# Patient Record
Sex: Male | Born: 1935
Health system: Southern US, Community
[De-identification: ages and names within clinical notes are randomized; demographics above are authoritative.]

## PROBLEM LIST (undated history)

## (undated) DIAGNOSIS — L0390 Cellulitis, unspecified: Secondary | ICD-10-CM

## (undated) DIAGNOSIS — M1990 Unspecified osteoarthritis, unspecified site: Secondary | ICD-10-CM

## (undated) DIAGNOSIS — C801 Malignant (primary) neoplasm, unspecified: Secondary | ICD-10-CM

## (undated) DIAGNOSIS — R42 Dizziness and giddiness: Secondary | ICD-10-CM

## (undated) DIAGNOSIS — J189 Pneumonia, unspecified organism: Secondary | ICD-10-CM

## (undated) DIAGNOSIS — R0600 Dyspnea, unspecified: Secondary | ICD-10-CM

## (undated) HISTORY — PX: COLONOSCOPY W/ POLYPECTOMY: SHX1380

## (undated) HISTORY — PX: NASAL HEMORRHAGE CONTROL: SHX287

## (undated) HISTORY — PX: TONSILLECTOMY: SUR1361

---

## 2011-05-12 HISTORY — PX: PROSTATECTOMY: SHX69

## 2011-12-28 DIAGNOSIS — R972 Elevated prostate specific antigen [PSA]: Secondary | ICD-10-CM | POA: Diagnosis not present

## 2011-12-28 DIAGNOSIS — C61 Malignant neoplasm of prostate: Secondary | ICD-10-CM | POA: Diagnosis not present

## 2011-12-28 DIAGNOSIS — E669 Obesity, unspecified: Secondary | ICD-10-CM | POA: Diagnosis not present

## 2012-01-13 DIAGNOSIS — R209 Unspecified disturbances of skin sensation: Secondary | ICD-10-CM | POA: Diagnosis not present

## 2012-01-13 DIAGNOSIS — J45909 Unspecified asthma, uncomplicated: Secondary | ICD-10-CM | POA: Diagnosis not present

## 2012-01-13 DIAGNOSIS — R609 Edema, unspecified: Secondary | ICD-10-CM | POA: Diagnosis not present

## 2012-01-13 DIAGNOSIS — R5381 Other malaise: Secondary | ICD-10-CM | POA: Diagnosis not present

## 2012-01-13 DIAGNOSIS — L299 Pruritus, unspecified: Secondary | ICD-10-CM | POA: Diagnosis not present

## 2012-01-13 DIAGNOSIS — R5383 Other fatigue: Secondary | ICD-10-CM | POA: Diagnosis not present

## 2012-01-22 DIAGNOSIS — L219 Seborrheic dermatitis, unspecified: Secondary | ICD-10-CM | POA: Diagnosis not present

## 2012-01-22 DIAGNOSIS — L981 Factitial dermatitis: Secondary | ICD-10-CM | POA: Diagnosis not present

## 2012-02-22 DIAGNOSIS — L219 Seborrheic dermatitis, unspecified: Secondary | ICD-10-CM | POA: Diagnosis not present

## 2012-03-24 DIAGNOSIS — E039 Hypothyroidism, unspecified: Secondary | ICD-10-CM | POA: Diagnosis not present

## 2012-03-24 DIAGNOSIS — I10 Essential (primary) hypertension: Secondary | ICD-10-CM | POA: Diagnosis not present

## 2012-03-24 DIAGNOSIS — Z8546 Personal history of malignant neoplasm of prostate: Secondary | ICD-10-CM | POA: Diagnosis not present

## 2012-03-24 DIAGNOSIS — E538 Deficiency of other specified B group vitamins: Secondary | ICD-10-CM | POA: Diagnosis not present

## 2012-04-28 DIAGNOSIS — E538 Deficiency of other specified B group vitamins: Secondary | ICD-10-CM | POA: Diagnosis not present

## 2012-04-28 DIAGNOSIS — R7989 Other specified abnormal findings of blood chemistry: Secondary | ICD-10-CM | POA: Diagnosis not present

## 2012-09-28 DIAGNOSIS — H2510 Age-related nuclear cataract, unspecified eye: Secondary | ICD-10-CM | POA: Diagnosis not present

## 2012-09-28 DIAGNOSIS — H5210 Myopia, unspecified eye: Secondary | ICD-10-CM | POA: Diagnosis not present

## 2013-02-08 DIAGNOSIS — R972 Elevated prostate specific antigen [PSA]: Secondary | ICD-10-CM | POA: Diagnosis not present

## 2013-02-08 DIAGNOSIS — E669 Obesity, unspecified: Secondary | ICD-10-CM | POA: Diagnosis not present

## 2014-01-17 DIAGNOSIS — R7301 Impaired fasting glucose: Secondary | ICD-10-CM | POA: Diagnosis not present

## 2014-01-17 DIAGNOSIS — M25559 Pain in unspecified hip: Secondary | ICD-10-CM | POA: Diagnosis not present

## 2014-01-17 DIAGNOSIS — M549 Dorsalgia, unspecified: Secondary | ICD-10-CM | POA: Diagnosis not present

## 2014-01-17 DIAGNOSIS — M542 Cervicalgia: Secondary | ICD-10-CM | POA: Diagnosis not present

## 2014-01-17 DIAGNOSIS — M2550 Pain in unspecified joint: Secondary | ICD-10-CM | POA: Diagnosis not present

## 2014-01-17 DIAGNOSIS — M25549 Pain in joints of unspecified hand: Secondary | ICD-10-CM | POA: Diagnosis not present

## 2014-01-17 DIAGNOSIS — Z8546 Personal history of malignant neoplasm of prostate: Secondary | ICD-10-CM | POA: Diagnosis not present

## 2014-01-17 DIAGNOSIS — R946 Abnormal results of thyroid function studies: Secondary | ICD-10-CM | POA: Diagnosis not present

## 2014-01-19 DIAGNOSIS — Z8546 Personal history of malignant neoplasm of prostate: Secondary | ICD-10-CM | POA: Diagnosis not present

## 2014-01-19 DIAGNOSIS — I998 Other disorder of circulatory system: Secondary | ICD-10-CM | POA: Diagnosis not present

## 2014-01-19 DIAGNOSIS — M549 Dorsalgia, unspecified: Secondary | ICD-10-CM | POA: Diagnosis not present

## 2014-01-19 DIAGNOSIS — M5380 Other specified dorsopathies, site unspecified: Secondary | ICD-10-CM | POA: Diagnosis not present

## 2014-01-19 DIAGNOSIS — M545 Low back pain, unspecified: Secondary | ICD-10-CM | POA: Diagnosis not present

## 2014-01-19 DIAGNOSIS — M47812 Spondylosis without myelopathy or radiculopathy, cervical region: Secondary | ICD-10-CM | POA: Diagnosis not present

## 2014-01-19 DIAGNOSIS — M19049 Primary osteoarthritis, unspecified hand: Secondary | ICD-10-CM | POA: Diagnosis not present

## 2014-01-19 DIAGNOSIS — M25549 Pain in joints of unspecified hand: Secondary | ICD-10-CM | POA: Diagnosis not present

## 2014-01-19 DIAGNOSIS — C61 Malignant neoplasm of prostate: Secondary | ICD-10-CM | POA: Diagnosis not present

## 2014-01-19 DIAGNOSIS — M25539 Pain in unspecified wrist: Secondary | ICD-10-CM | POA: Diagnosis not present

## 2014-01-19 DIAGNOSIS — M25559 Pain in unspecified hip: Secondary | ICD-10-CM | POA: Diagnosis not present

## 2014-01-30 DIAGNOSIS — M159 Polyosteoarthritis, unspecified: Secondary | ICD-10-CM | POA: Diagnosis not present

## 2014-01-30 DIAGNOSIS — M5030 Other cervical disc degeneration, unspecified cervical region: Secondary | ICD-10-CM | POA: Diagnosis not present

## 2014-01-30 DIAGNOSIS — E039 Hypothyroidism, unspecified: Secondary | ICD-10-CM | POA: Diagnosis not present

## 2014-02-08 DIAGNOSIS — M4802 Spinal stenosis, cervical region: Secondary | ICD-10-CM | POA: Diagnosis not present

## 2014-02-08 DIAGNOSIS — M542 Cervicalgia: Secondary | ICD-10-CM | POA: Diagnosis not present

## 2014-02-26 DIAGNOSIS — M542 Cervicalgia: Secondary | ICD-10-CM | POA: Insufficient documentation

## 2014-02-26 DIAGNOSIS — M5136 Other intervertebral disc degeneration, lumbar region: Secondary | ICD-10-CM | POA: Diagnosis not present

## 2014-02-26 DIAGNOSIS — M47816 Spondylosis without myelopathy or radiculopathy, lumbar region: Secondary | ICD-10-CM | POA: Diagnosis not present

## 2014-02-26 DIAGNOSIS — R2681 Unsteadiness on feet: Secondary | ICD-10-CM | POA: Diagnosis not present

## 2014-02-26 DIAGNOSIS — M545 Low back pain, unspecified: Secondary | ICD-10-CM | POA: Insufficient documentation

## 2014-02-26 DIAGNOSIS — G8929 Other chronic pain: Secondary | ICD-10-CM | POA: Insufficient documentation

## 2014-03-05 DIAGNOSIS — S335XXD Sprain of ligaments of lumbar spine, subsequent encounter: Secondary | ICD-10-CM | POA: Diagnosis not present

## 2014-03-07 DIAGNOSIS — S335XXD Sprain of ligaments of lumbar spine, subsequent encounter: Secondary | ICD-10-CM | POA: Diagnosis not present

## 2014-03-09 DIAGNOSIS — S335XXD Sprain of ligaments of lumbar spine, subsequent encounter: Secondary | ICD-10-CM | POA: Diagnosis not present

## 2014-03-13 DIAGNOSIS — S335XXD Sprain of ligaments of lumbar spine, subsequent encounter: Secondary | ICD-10-CM | POA: Diagnosis not present

## 2014-03-15 DIAGNOSIS — S335XXD Sprain of ligaments of lumbar spine, subsequent encounter: Secondary | ICD-10-CM | POA: Diagnosis not present

## 2014-03-21 DIAGNOSIS — S335XXD Sprain of ligaments of lumbar spine, subsequent encounter: Secondary | ICD-10-CM | POA: Diagnosis not present

## 2014-03-26 DIAGNOSIS — S335XXD Sprain of ligaments of lumbar spine, subsequent encounter: Secondary | ICD-10-CM | POA: Diagnosis not present

## 2014-03-28 DIAGNOSIS — S335XXD Sprain of ligaments of lumbar spine, subsequent encounter: Secondary | ICD-10-CM | POA: Diagnosis not present

## 2014-04-02 DIAGNOSIS — S335XXD Sprain of ligaments of lumbar spine, subsequent encounter: Secondary | ICD-10-CM | POA: Diagnosis not present

## 2014-04-04 DIAGNOSIS — S335XXD Sprain of ligaments of lumbar spine, subsequent encounter: Secondary | ICD-10-CM | POA: Diagnosis not present

## 2014-04-11 DIAGNOSIS — S335XXD Sprain of ligaments of lumbar spine, subsequent encounter: Secondary | ICD-10-CM | POA: Diagnosis not present

## 2014-04-17 DIAGNOSIS — S335XXD Sprain of ligaments of lumbar spine, subsequent encounter: Secondary | ICD-10-CM | POA: Diagnosis not present

## 2014-04-26 DIAGNOSIS — S335XXD Sprain of ligaments of lumbar spine, subsequent encounter: Secondary | ICD-10-CM | POA: Diagnosis not present

## 2014-10-02 DIAGNOSIS — M2550 Pain in unspecified joint: Secondary | ICD-10-CM | POA: Diagnosis not present

## 2014-10-09 DIAGNOSIS — E039 Hypothyroidism, unspecified: Secondary | ICD-10-CM | POA: Diagnosis not present

## 2014-10-09 DIAGNOSIS — Z8546 Personal history of malignant neoplasm of prostate: Secondary | ICD-10-CM | POA: Diagnosis not present

## 2014-10-09 DIAGNOSIS — R7301 Impaired fasting glucose: Secondary | ICD-10-CM | POA: Diagnosis not present

## 2015-01-17 DIAGNOSIS — B359 Dermatophytosis, unspecified: Secondary | ICD-10-CM | POA: Diagnosis not present

## 2015-01-17 DIAGNOSIS — M7989 Other specified soft tissue disorders: Secondary | ICD-10-CM | POA: Diagnosis not present

## 2015-01-17 DIAGNOSIS — M2550 Pain in unspecified joint: Secondary | ICD-10-CM | POA: Diagnosis not present

## 2015-01-17 DIAGNOSIS — I872 Venous insufficiency (chronic) (peripheral): Secondary | ICD-10-CM | POA: Diagnosis not present

## 2015-05-10 DIAGNOSIS — R739 Hyperglycemia, unspecified: Secondary | ICD-10-CM | POA: Diagnosis not present

## 2015-05-10 DIAGNOSIS — R609 Edema, unspecified: Secondary | ICD-10-CM | POA: Diagnosis not present

## 2015-05-10 DIAGNOSIS — L03115 Cellulitis of right lower limb: Secondary | ICD-10-CM | POA: Diagnosis not present

## 2015-05-10 DIAGNOSIS — R0602 Shortness of breath: Secondary | ICD-10-CM | POA: Diagnosis not present

## 2015-05-10 DIAGNOSIS — M7989 Other specified soft tissue disorders: Secondary | ICD-10-CM | POA: Diagnosis not present

## 2015-05-10 DIAGNOSIS — M79606 Pain in leg, unspecified: Secondary | ICD-10-CM | POA: Diagnosis not present

## 2015-05-10 DIAGNOSIS — R52 Pain, unspecified: Secondary | ICD-10-CM | POA: Diagnosis not present

## 2015-05-10 DIAGNOSIS — I160 Hypertensive urgency: Secondary | ICD-10-CM | POA: Diagnosis not present

## 2015-05-10 DIAGNOSIS — Z79899 Other long term (current) drug therapy: Secondary | ICD-10-CM | POA: Diagnosis not present

## 2015-05-10 DIAGNOSIS — Z7982 Long term (current) use of aspirin: Secondary | ICD-10-CM | POA: Diagnosis not present

## 2015-05-21 DIAGNOSIS — M7989 Other specified soft tissue disorders: Secondary | ICD-10-CM | POA: Diagnosis not present

## 2015-05-21 DIAGNOSIS — L03115 Cellulitis of right lower limb: Secondary | ICD-10-CM | POA: Diagnosis not present

## 2015-05-21 DIAGNOSIS — I872 Venous insufficiency (chronic) (peripheral): Secondary | ICD-10-CM | POA: Diagnosis not present

## 2015-05-30 DIAGNOSIS — L03119 Cellulitis of unspecified part of limb: Secondary | ICD-10-CM | POA: Diagnosis not present

## 2015-05-30 DIAGNOSIS — Z79899 Other long term (current) drug therapy: Secondary | ICD-10-CM | POA: Diagnosis not present

## 2015-06-21 DIAGNOSIS — L299 Pruritus, unspecified: Secondary | ICD-10-CM | POA: Diagnosis not present

## 2015-06-21 DIAGNOSIS — I8310 Varicose veins of unspecified lower extremity with inflammation: Secondary | ICD-10-CM | POA: Diagnosis not present

## 2015-06-21 DIAGNOSIS — L0100 Impetigo, unspecified: Secondary | ICD-10-CM | POA: Diagnosis not present

## 2015-06-21 DIAGNOSIS — L853 Xerosis cutis: Secondary | ICD-10-CM | POA: Diagnosis not present

## 2015-06-25 DIAGNOSIS — E039 Hypothyroidism, unspecified: Secondary | ICD-10-CM | POA: Diagnosis not present

## 2015-06-25 DIAGNOSIS — I872 Venous insufficiency (chronic) (peripheral): Secondary | ICD-10-CM | POA: Diagnosis not present

## 2015-06-25 DIAGNOSIS — M2550 Pain in unspecified joint: Secondary | ICD-10-CM | POA: Diagnosis not present

## 2015-06-25 DIAGNOSIS — J019 Acute sinusitis, unspecified: Secondary | ICD-10-CM | POA: Diagnosis not present

## 2015-06-25 DIAGNOSIS — L03119 Cellulitis of unspecified part of limb: Secondary | ICD-10-CM | POA: Diagnosis not present

## 2015-06-25 DIAGNOSIS — Z79899 Other long term (current) drug therapy: Secondary | ICD-10-CM | POA: Diagnosis not present

## 2015-07-03 DIAGNOSIS — R600 Localized edema: Secondary | ICD-10-CM | POA: Diagnosis not present

## 2015-07-03 DIAGNOSIS — E039 Hypothyroidism, unspecified: Secondary | ICD-10-CM | POA: Diagnosis not present

## 2015-07-03 DIAGNOSIS — M2550 Pain in unspecified joint: Secondary | ICD-10-CM | POA: Diagnosis not present

## 2015-08-06 DIAGNOSIS — M5030 Other cervical disc degeneration, unspecified cervical region: Secondary | ICD-10-CM | POA: Diagnosis not present

## 2015-08-06 DIAGNOSIS — M6281 Muscle weakness (generalized): Secondary | ICD-10-CM | POA: Diagnosis not present

## 2015-08-06 DIAGNOSIS — R2689 Other abnormalities of gait and mobility: Secondary | ICD-10-CM | POA: Diagnosis not present

## 2015-08-12 DIAGNOSIS — R2689 Other abnormalities of gait and mobility: Secondary | ICD-10-CM | POA: Diagnosis not present

## 2015-08-12 DIAGNOSIS — M6281 Muscle weakness (generalized): Secondary | ICD-10-CM | POA: Diagnosis not present

## 2015-08-12 DIAGNOSIS — M5030 Other cervical disc degeneration, unspecified cervical region: Secondary | ICD-10-CM | POA: Diagnosis not present

## 2015-08-14 DIAGNOSIS — R2689 Other abnormalities of gait and mobility: Secondary | ICD-10-CM | POA: Diagnosis not present

## 2015-08-14 DIAGNOSIS — M5030 Other cervical disc degeneration, unspecified cervical region: Secondary | ICD-10-CM | POA: Diagnosis not present

## 2015-08-14 DIAGNOSIS — M6281 Muscle weakness (generalized): Secondary | ICD-10-CM | POA: Diagnosis not present

## 2015-08-19 DIAGNOSIS — M5030 Other cervical disc degeneration, unspecified cervical region: Secondary | ICD-10-CM | POA: Diagnosis not present

## 2015-08-19 DIAGNOSIS — R2689 Other abnormalities of gait and mobility: Secondary | ICD-10-CM | POA: Diagnosis not present

## 2015-08-19 DIAGNOSIS — M6281 Muscle weakness (generalized): Secondary | ICD-10-CM | POA: Diagnosis not present

## 2015-08-21 DIAGNOSIS — Z79899 Other long term (current) drug therapy: Secondary | ICD-10-CM | POA: Diagnosis not present

## 2015-08-21 DIAGNOSIS — M2550 Pain in unspecified joint: Secondary | ICD-10-CM | POA: Diagnosis not present

## 2015-08-21 DIAGNOSIS — R7301 Impaired fasting glucose: Secondary | ICD-10-CM | POA: Diagnosis not present

## 2015-08-21 DIAGNOSIS — G4700 Insomnia, unspecified: Secondary | ICD-10-CM | POA: Diagnosis not present

## 2015-08-21 DIAGNOSIS — E538 Deficiency of other specified B group vitamins: Secondary | ICD-10-CM | POA: Diagnosis not present

## 2015-08-21 DIAGNOSIS — R5383 Other fatigue: Secondary | ICD-10-CM | POA: Diagnosis not present

## 2015-08-21 DIAGNOSIS — I872 Venous insufficiency (chronic) (peripheral): Secondary | ICD-10-CM | POA: Diagnosis not present

## 2015-08-21 DIAGNOSIS — Z8546 Personal history of malignant neoplasm of prostate: Secondary | ICD-10-CM | POA: Diagnosis not present

## 2015-08-21 DIAGNOSIS — E039 Hypothyroidism, unspecified: Secondary | ICD-10-CM | POA: Diagnosis not present

## 2015-08-26 DIAGNOSIS — M5030 Other cervical disc degeneration, unspecified cervical region: Secondary | ICD-10-CM | POA: Diagnosis not present

## 2015-08-26 DIAGNOSIS — M6281 Muscle weakness (generalized): Secondary | ICD-10-CM | POA: Diagnosis not present

## 2015-08-26 DIAGNOSIS — R2689 Other abnormalities of gait and mobility: Secondary | ICD-10-CM | POA: Diagnosis not present

## 2015-08-28 DIAGNOSIS — M5030 Other cervical disc degeneration, unspecified cervical region: Secondary | ICD-10-CM | POA: Diagnosis not present

## 2015-08-28 DIAGNOSIS — M6281 Muscle weakness (generalized): Secondary | ICD-10-CM | POA: Diagnosis not present

## 2015-08-28 DIAGNOSIS — R2689 Other abnormalities of gait and mobility: Secondary | ICD-10-CM | POA: Diagnosis not present

## 2015-09-02 DIAGNOSIS — M5030 Other cervical disc degeneration, unspecified cervical region: Secondary | ICD-10-CM | POA: Diagnosis not present

## 2015-09-02 DIAGNOSIS — M6281 Muscle weakness (generalized): Secondary | ICD-10-CM | POA: Diagnosis not present

## 2015-09-02 DIAGNOSIS — R2689 Other abnormalities of gait and mobility: Secondary | ICD-10-CM | POA: Diagnosis not present

## 2015-09-02 DIAGNOSIS — M62838 Other muscle spasm: Secondary | ICD-10-CM | POA: Diagnosis not present

## 2015-09-04 DIAGNOSIS — M5030 Other cervical disc degeneration, unspecified cervical region: Secondary | ICD-10-CM | POA: Diagnosis not present

## 2015-09-04 DIAGNOSIS — M6281 Muscle weakness (generalized): Secondary | ICD-10-CM | POA: Diagnosis not present

## 2015-09-04 DIAGNOSIS — R2689 Other abnormalities of gait and mobility: Secondary | ICD-10-CM | POA: Diagnosis not present

## 2015-09-09 DIAGNOSIS — M5030 Other cervical disc degeneration, unspecified cervical region: Secondary | ICD-10-CM | POA: Diagnosis not present

## 2015-09-09 DIAGNOSIS — R2689 Other abnormalities of gait and mobility: Secondary | ICD-10-CM | POA: Diagnosis not present

## 2015-09-09 DIAGNOSIS — M6281 Muscle weakness (generalized): Secondary | ICD-10-CM | POA: Diagnosis not present

## 2015-09-11 DIAGNOSIS — M5030 Other cervical disc degeneration, unspecified cervical region: Secondary | ICD-10-CM | POA: Diagnosis not present

## 2015-09-11 DIAGNOSIS — M6281 Muscle weakness (generalized): Secondary | ICD-10-CM | POA: Diagnosis not present

## 2015-09-11 DIAGNOSIS — R2689 Other abnormalities of gait and mobility: Secondary | ICD-10-CM | POA: Diagnosis not present

## 2015-09-25 DIAGNOSIS — M6281 Muscle weakness (generalized): Secondary | ICD-10-CM | POA: Diagnosis not present

## 2015-09-25 DIAGNOSIS — R2689 Other abnormalities of gait and mobility: Secondary | ICD-10-CM | POA: Diagnosis not present

## 2015-09-25 DIAGNOSIS — M5030 Other cervical disc degeneration, unspecified cervical region: Secondary | ICD-10-CM | POA: Diagnosis not present

## 2015-09-26 DIAGNOSIS — Z6841 Body Mass Index (BMI) 40.0 and over, adult: Secondary | ICD-10-CM | POA: Diagnosis not present

## 2015-09-26 DIAGNOSIS — C61 Malignant neoplasm of prostate: Secondary | ICD-10-CM | POA: Diagnosis not present

## 2015-10-02 DIAGNOSIS — M5030 Other cervical disc degeneration, unspecified cervical region: Secondary | ICD-10-CM | POA: Diagnosis not present

## 2015-10-02 DIAGNOSIS — M6281 Muscle weakness (generalized): Secondary | ICD-10-CM | POA: Diagnosis not present

## 2015-10-02 DIAGNOSIS — R2689 Other abnormalities of gait and mobility: Secondary | ICD-10-CM | POA: Diagnosis not present

## 2015-10-09 DIAGNOSIS — M5030 Other cervical disc degeneration, unspecified cervical region: Secondary | ICD-10-CM | POA: Diagnosis not present

## 2015-10-09 DIAGNOSIS — R2689 Other abnormalities of gait and mobility: Secondary | ICD-10-CM | POA: Diagnosis not present

## 2015-10-09 DIAGNOSIS — M6281 Muscle weakness (generalized): Secondary | ICD-10-CM | POA: Diagnosis not present

## 2015-10-29 DIAGNOSIS — Z8546 Personal history of malignant neoplasm of prostate: Secondary | ICD-10-CM | POA: Diagnosis not present

## 2015-10-29 DIAGNOSIS — H6982 Other specified disorders of Eustachian tube, left ear: Secondary | ICD-10-CM | POA: Diagnosis not present

## 2015-10-29 DIAGNOSIS — H60542 Acute eczematoid otitis externa, left ear: Secondary | ICD-10-CM | POA: Diagnosis not present

## 2015-11-06 DIAGNOSIS — C61 Malignant neoplasm of prostate: Secondary | ICD-10-CM | POA: Diagnosis not present

## 2015-11-06 DIAGNOSIS — Z6841 Body Mass Index (BMI) 40.0 and over, adult: Secondary | ICD-10-CM | POA: Diagnosis not present

## 2016-01-07 ENCOUNTER — Other Ambulatory Visit: Payer: Self-pay

## 2016-03-24 DIAGNOSIS — H524 Presbyopia: Secondary | ICD-10-CM | POA: Diagnosis not present

## 2016-03-24 DIAGNOSIS — H2513 Age-related nuclear cataract, bilateral: Secondary | ICD-10-CM | POA: Diagnosis not present

## 2016-03-24 DIAGNOSIS — H40003 Preglaucoma, unspecified, bilateral: Secondary | ICD-10-CM | POA: Diagnosis not present

## 2016-04-09 DIAGNOSIS — J019 Acute sinusitis, unspecified: Secondary | ICD-10-CM | POA: Diagnosis not present

## 2016-04-09 DIAGNOSIS — R42 Dizziness and giddiness: Secondary | ICD-10-CM | POA: Diagnosis not present

## 2016-04-14 DIAGNOSIS — H401131 Primary open-angle glaucoma, bilateral, mild stage: Secondary | ICD-10-CM | POA: Diagnosis not present

## 2016-05-25 DIAGNOSIS — E039 Hypothyroidism, unspecified: Secondary | ICD-10-CM | POA: Diagnosis not present

## 2016-05-25 DIAGNOSIS — R7301 Impaired fasting glucose: Secondary | ICD-10-CM | POA: Diagnosis not present

## 2016-05-25 DIAGNOSIS — E538 Deficiency of other specified B group vitamins: Secondary | ICD-10-CM | POA: Diagnosis not present

## 2016-05-25 DIAGNOSIS — R7989 Other specified abnormal findings of blood chemistry: Secondary | ICD-10-CM | POA: Diagnosis not present

## 2016-05-25 DIAGNOSIS — Z8546 Personal history of malignant neoplasm of prostate: Secondary | ICD-10-CM | POA: Diagnosis not present

## 2016-07-30 DIAGNOSIS — H401131 Primary open-angle glaucoma, bilateral, mild stage: Secondary | ICD-10-CM | POA: Diagnosis not present

## 2016-11-02 DIAGNOSIS — H401131 Primary open-angle glaucoma, bilateral, mild stage: Secondary | ICD-10-CM | POA: Diagnosis not present

## 2016-12-30 DIAGNOSIS — M25561 Pain in right knee: Secondary | ICD-10-CM | POA: Diagnosis not present

## 2017-01-04 DIAGNOSIS — M7041 Prepatellar bursitis, right knee: Secondary | ICD-10-CM | POA: Diagnosis not present

## 2017-02-02 DIAGNOSIS — H401131 Primary open-angle glaucoma, bilateral, mild stage: Secondary | ICD-10-CM | POA: Diagnosis not present

## 2017-02-04 DIAGNOSIS — M1711 Unilateral primary osteoarthritis, right knee: Secondary | ICD-10-CM | POA: Diagnosis not present

## 2017-02-04 DIAGNOSIS — M7041 Prepatellar bursitis, right knee: Secondary | ICD-10-CM | POA: Diagnosis not present

## 2017-02-05 DIAGNOSIS — M7041 Prepatellar bursitis, right knee: Secondary | ICD-10-CM | POA: Diagnosis not present

## 2017-02-05 DIAGNOSIS — Z7901 Long term (current) use of anticoagulants: Secondary | ICD-10-CM | POA: Diagnosis not present

## 2017-02-08 DIAGNOSIS — M25061 Hemarthrosis, right knee: Secondary | ICD-10-CM | POA: Diagnosis not present

## 2017-02-12 DIAGNOSIS — M7041 Prepatellar bursitis, right knee: Secondary | ICD-10-CM | POA: Diagnosis not present

## 2017-02-15 DIAGNOSIS — M25561 Pain in right knee: Secondary | ICD-10-CM | POA: Diagnosis not present

## 2017-02-18 DIAGNOSIS — M1711 Unilateral primary osteoarthritis, right knee: Secondary | ICD-10-CM | POA: Diagnosis not present

## 2017-02-18 DIAGNOSIS — M7041 Prepatellar bursitis, right knee: Secondary | ICD-10-CM | POA: Diagnosis not present

## 2017-03-18 DIAGNOSIS — M7041 Prepatellar bursitis, right knee: Secondary | ICD-10-CM | POA: Diagnosis not present

## 2017-03-30 DIAGNOSIS — R9431 Abnormal electrocardiogram [ECG] [EKG]: Secondary | ICD-10-CM | POA: Diagnosis not present

## 2017-03-30 DIAGNOSIS — Z01818 Encounter for other preprocedural examination: Secondary | ICD-10-CM | POA: Diagnosis not present

## 2017-03-30 DIAGNOSIS — M7041 Prepatellar bursitis, right knee: Secondary | ICD-10-CM | POA: Diagnosis not present

## 2017-03-30 DIAGNOSIS — I872 Venous insufficiency (chronic) (peripheral): Secondary | ICD-10-CM | POA: Diagnosis not present

## 2017-04-15 DIAGNOSIS — M1711 Unilateral primary osteoarthritis, right knee: Secondary | ICD-10-CM | POA: Diagnosis not present

## 2017-04-15 DIAGNOSIS — M7041 Prepatellar bursitis, right knee: Secondary | ICD-10-CM | POA: Diagnosis not present

## 2017-04-20 DIAGNOSIS — I10 Essential (primary) hypertension: Secondary | ICD-10-CM | POA: Diagnosis not present

## 2017-04-20 DIAGNOSIS — M7040 Prepatellar bursitis, unspecified knee: Secondary | ICD-10-CM | POA: Diagnosis not present

## 2017-04-20 DIAGNOSIS — M7041 Prepatellar bursitis, right knee: Secondary | ICD-10-CM | POA: Diagnosis not present

## 2017-04-20 DIAGNOSIS — E669 Obesity, unspecified: Secondary | ICD-10-CM | POA: Diagnosis not present

## 2017-04-21 HISTORY — PX: KNEE SURGERY: SHX244

## 2017-04-22 DIAGNOSIS — Z8679 Personal history of other diseases of the circulatory system: Secondary | ICD-10-CM | POA: Diagnosis not present

## 2017-04-22 DIAGNOSIS — Z7982 Long term (current) use of aspirin: Secondary | ICD-10-CM | POA: Diagnosis not present

## 2017-04-22 DIAGNOSIS — E039 Hypothyroidism, unspecified: Secondary | ICD-10-CM | POA: Diagnosis present

## 2017-04-22 DIAGNOSIS — M7041 Prepatellar bursitis, right knee: Secondary | ICD-10-CM | POA: Diagnosis present

## 2017-04-22 DIAGNOSIS — Z79899 Other long term (current) drug therapy: Secondary | ICD-10-CM | POA: Diagnosis not present

## 2017-04-22 DIAGNOSIS — Z79891 Long term (current) use of opiate analgesic: Secondary | ICD-10-CM | POA: Diagnosis not present

## 2017-04-22 DIAGNOSIS — M1711 Unilateral primary osteoarthritis, right knee: Secondary | ICD-10-CM | POA: Diagnosis present

## 2017-04-22 DIAGNOSIS — Z6841 Body Mass Index (BMI) 40.0 and over, adult: Secondary | ICD-10-CM | POA: Diagnosis not present

## 2017-04-22 DIAGNOSIS — Z8546 Personal history of malignant neoplasm of prostate: Secondary | ICD-10-CM | POA: Diagnosis not present

## 2017-04-22 DIAGNOSIS — I2510 Atherosclerotic heart disease of native coronary artery without angina pectoris: Secondary | ICD-10-CM | POA: Diagnosis present

## 2017-04-22 DIAGNOSIS — R600 Localized edema: Secondary | ICD-10-CM | POA: Diagnosis present

## 2017-04-22 DIAGNOSIS — J302 Other seasonal allergic rhinitis: Secondary | ICD-10-CM | POA: Diagnosis present

## 2017-04-22 DIAGNOSIS — Z9181 History of falling: Secondary | ICD-10-CM | POA: Diagnosis not present

## 2017-04-24 DIAGNOSIS — Z8546 Personal history of malignant neoplasm of prostate: Secondary | ICD-10-CM | POA: Diagnosis not present

## 2017-04-24 DIAGNOSIS — Z96651 Presence of right artificial knee joint: Secondary | ICD-10-CM | POA: Diagnosis not present

## 2017-04-24 DIAGNOSIS — Z4789 Encounter for other orthopedic aftercare: Secondary | ICD-10-CM | POA: Diagnosis not present

## 2017-04-24 DIAGNOSIS — F419 Anxiety disorder, unspecified: Secondary | ICD-10-CM | POA: Diagnosis not present

## 2017-04-24 DIAGNOSIS — Z9181 History of falling: Secondary | ICD-10-CM | POA: Diagnosis not present

## 2017-04-25 DIAGNOSIS — Z4789 Encounter for other orthopedic aftercare: Secondary | ICD-10-CM | POA: Diagnosis not present

## 2017-04-25 DIAGNOSIS — Z9181 History of falling: Secondary | ICD-10-CM | POA: Diagnosis not present

## 2017-04-25 DIAGNOSIS — Z96651 Presence of right artificial knee joint: Secondary | ICD-10-CM | POA: Diagnosis not present

## 2017-04-25 DIAGNOSIS — F419 Anxiety disorder, unspecified: Secondary | ICD-10-CM | POA: Diagnosis not present

## 2017-04-25 DIAGNOSIS — Z8546 Personal history of malignant neoplasm of prostate: Secondary | ICD-10-CM | POA: Diagnosis not present

## 2017-04-27 DIAGNOSIS — Z8546 Personal history of malignant neoplasm of prostate: Secondary | ICD-10-CM | POA: Diagnosis not present

## 2017-04-27 DIAGNOSIS — Z4789 Encounter for other orthopedic aftercare: Secondary | ICD-10-CM | POA: Diagnosis not present

## 2017-04-27 DIAGNOSIS — Z9181 History of falling: Secondary | ICD-10-CM | POA: Diagnosis not present

## 2017-04-27 DIAGNOSIS — Z96651 Presence of right artificial knee joint: Secondary | ICD-10-CM | POA: Diagnosis not present

## 2017-04-27 DIAGNOSIS — F419 Anxiety disorder, unspecified: Secondary | ICD-10-CM | POA: Diagnosis not present

## 2017-05-10 DIAGNOSIS — F419 Anxiety disorder, unspecified: Secondary | ICD-10-CM | POA: Diagnosis not present

## 2017-05-10 DIAGNOSIS — Z9181 History of falling: Secondary | ICD-10-CM | POA: Diagnosis not present

## 2017-05-10 DIAGNOSIS — Z96651 Presence of right artificial knee joint: Secondary | ICD-10-CM | POA: Diagnosis not present

## 2017-05-10 DIAGNOSIS — Z4789 Encounter for other orthopedic aftercare: Secondary | ICD-10-CM | POA: Diagnosis not present

## 2017-05-10 DIAGNOSIS — Z8546 Personal history of malignant neoplasm of prostate: Secondary | ICD-10-CM | POA: Diagnosis not present

## 2017-05-19 DIAGNOSIS — F419 Anxiety disorder, unspecified: Secondary | ICD-10-CM | POA: Diagnosis not present

## 2017-05-19 DIAGNOSIS — Z8546 Personal history of malignant neoplasm of prostate: Secondary | ICD-10-CM | POA: Diagnosis not present

## 2017-05-19 DIAGNOSIS — Z4789 Encounter for other orthopedic aftercare: Secondary | ICD-10-CM | POA: Diagnosis not present

## 2017-05-19 DIAGNOSIS — Z96651 Presence of right artificial knee joint: Secondary | ICD-10-CM | POA: Diagnosis not present

## 2017-05-19 DIAGNOSIS — Z9181 History of falling: Secondary | ICD-10-CM | POA: Diagnosis not present

## 2017-05-20 DIAGNOSIS — E039 Hypothyroidism, unspecified: Secondary | ICD-10-CM | POA: Diagnosis not present

## 2017-05-20 DIAGNOSIS — E538 Deficiency of other specified B group vitamins: Secondary | ICD-10-CM | POA: Diagnosis not present

## 2017-05-20 DIAGNOSIS — Z8546 Personal history of malignant neoplasm of prostate: Secondary | ICD-10-CM | POA: Diagnosis not present

## 2017-05-20 DIAGNOSIS — Z79899 Other long term (current) drug therapy: Secondary | ICD-10-CM | POA: Diagnosis not present

## 2017-05-20 DIAGNOSIS — R42 Dizziness and giddiness: Secondary | ICD-10-CM | POA: Diagnosis not present

## 2017-05-20 DIAGNOSIS — M7989 Other specified soft tissue disorders: Secondary | ICD-10-CM | POA: Diagnosis not present

## 2017-05-21 DIAGNOSIS — Z8546 Personal history of malignant neoplasm of prostate: Secondary | ICD-10-CM | POA: Diagnosis not present

## 2017-05-21 DIAGNOSIS — Z96651 Presence of right artificial knee joint: Secondary | ICD-10-CM | POA: Diagnosis not present

## 2017-05-21 DIAGNOSIS — F419 Anxiety disorder, unspecified: Secondary | ICD-10-CM | POA: Diagnosis not present

## 2017-05-21 DIAGNOSIS — Z4789 Encounter for other orthopedic aftercare: Secondary | ICD-10-CM | POA: Diagnosis not present

## 2017-05-21 DIAGNOSIS — Z9181 History of falling: Secondary | ICD-10-CM | POA: Diagnosis not present

## 2017-05-24 DIAGNOSIS — F419 Anxiety disorder, unspecified: Secondary | ICD-10-CM | POA: Diagnosis not present

## 2017-05-24 DIAGNOSIS — Z9181 History of falling: Secondary | ICD-10-CM | POA: Diagnosis not present

## 2017-05-24 DIAGNOSIS — Z8546 Personal history of malignant neoplasm of prostate: Secondary | ICD-10-CM | POA: Diagnosis not present

## 2017-05-24 DIAGNOSIS — Z4789 Encounter for other orthopedic aftercare: Secondary | ICD-10-CM | POA: Diagnosis not present

## 2017-05-24 DIAGNOSIS — Z96651 Presence of right artificial knee joint: Secondary | ICD-10-CM | POA: Diagnosis not present

## 2017-05-25 DIAGNOSIS — F419 Anxiety disorder, unspecified: Secondary | ICD-10-CM | POA: Diagnosis not present

## 2017-05-25 DIAGNOSIS — Z9181 History of falling: Secondary | ICD-10-CM | POA: Diagnosis not present

## 2017-05-25 DIAGNOSIS — Z4789 Encounter for other orthopedic aftercare: Secondary | ICD-10-CM | POA: Diagnosis not present

## 2017-05-25 DIAGNOSIS — Z8546 Personal history of malignant neoplasm of prostate: Secondary | ICD-10-CM | POA: Diagnosis not present

## 2017-05-25 DIAGNOSIS — Z96651 Presence of right artificial knee joint: Secondary | ICD-10-CM | POA: Diagnosis not present

## 2017-05-28 DIAGNOSIS — F419 Anxiety disorder, unspecified: Secondary | ICD-10-CM | POA: Diagnosis not present

## 2017-05-28 DIAGNOSIS — Z96651 Presence of right artificial knee joint: Secondary | ICD-10-CM | POA: Diagnosis not present

## 2017-05-28 DIAGNOSIS — Z8546 Personal history of malignant neoplasm of prostate: Secondary | ICD-10-CM | POA: Diagnosis not present

## 2017-05-28 DIAGNOSIS — Z9181 History of falling: Secondary | ICD-10-CM | POA: Diagnosis not present

## 2017-05-28 DIAGNOSIS — Z4789 Encounter for other orthopedic aftercare: Secondary | ICD-10-CM | POA: Diagnosis not present

## 2017-06-10 DIAGNOSIS — Z4789 Encounter for other orthopedic aftercare: Secondary | ICD-10-CM | POA: Diagnosis not present

## 2017-06-10 DIAGNOSIS — F419 Anxiety disorder, unspecified: Secondary | ICD-10-CM | POA: Diagnosis not present

## 2017-06-10 DIAGNOSIS — Z96651 Presence of right artificial knee joint: Secondary | ICD-10-CM | POA: Diagnosis not present

## 2017-06-10 DIAGNOSIS — Z8546 Personal history of malignant neoplasm of prostate: Secondary | ICD-10-CM | POA: Diagnosis not present

## 2017-06-10 DIAGNOSIS — Z9181 History of falling: Secondary | ICD-10-CM | POA: Diagnosis not present

## 2017-07-01 DIAGNOSIS — R972 Elevated prostate specific antigen [PSA]: Secondary | ICD-10-CM | POA: Diagnosis not present

## 2017-07-01 DIAGNOSIS — C61 Malignant neoplasm of prostate: Secondary | ICD-10-CM | POA: Diagnosis not present

## 2017-07-08 DIAGNOSIS — M7041 Prepatellar bursitis, right knee: Secondary | ICD-10-CM | POA: Diagnosis not present

## 2017-07-22 DIAGNOSIS — L7634 Postprocedural seroma of skin and subcutaneous tissue following other procedure: Secondary | ICD-10-CM | POA: Diagnosis not present

## 2017-07-22 DIAGNOSIS — Z6841 Body Mass Index (BMI) 40.0 and over, adult: Secondary | ICD-10-CM | POA: Diagnosis not present

## 2017-07-22 DIAGNOSIS — C61 Malignant neoplasm of prostate: Secondary | ICD-10-CM | POA: Insufficient documentation

## 2017-08-06 DIAGNOSIS — M545 Low back pain: Secondary | ICD-10-CM | POA: Diagnosis not present

## 2017-08-06 DIAGNOSIS — M533 Sacrococcygeal disorders, not elsewhere classified: Secondary | ICD-10-CM | POA: Diagnosis not present

## 2017-08-10 DIAGNOSIS — S81001A Unspecified open wound, right knee, initial encounter: Secondary | ICD-10-CM | POA: Insufficient documentation

## 2017-08-10 DIAGNOSIS — S81001D Unspecified open wound, right knee, subsequent encounter: Secondary | ICD-10-CM | POA: Diagnosis not present

## 2017-08-10 DIAGNOSIS — L7634 Postprocedural seroma of skin and subcutaneous tissue following other procedure: Secondary | ICD-10-CM | POA: Diagnosis not present

## 2017-08-12 DIAGNOSIS — R52 Pain, unspecified: Secondary | ICD-10-CM | POA: Diagnosis not present

## 2017-08-12 DIAGNOSIS — M533 Sacrococcygeal disorders, not elsewhere classified: Secondary | ICD-10-CM | POA: Diagnosis not present

## 2017-08-23 ENCOUNTER — Encounter (HOSPITAL_BASED_OUTPATIENT_CLINIC_OR_DEPARTMENT_OTHER): Payer: Self-pay | Admitting: *Deleted

## 2017-08-23 ENCOUNTER — Other Ambulatory Visit: Payer: Self-pay

## 2017-08-23 NOTE — Progress Notes (Signed)
Patient states he will go to primary care doctor in [place]) for BMP and have them fax results.  If not received we will do ISTAT day of surgery.

## 2017-08-24 DIAGNOSIS — Z79899 Other long term (current) drug therapy: Secondary | ICD-10-CM | POA: Diagnosis not present

## 2017-08-24 DIAGNOSIS — R7301 Impaired fasting glucose: Secondary | ICD-10-CM | POA: Diagnosis not present

## 2017-09-07 ENCOUNTER — Ambulatory Visit: Payer: Self-pay | Admitting: Plastic Surgery

## 2017-09-08 ENCOUNTER — Encounter (HOSPITAL_BASED_OUTPATIENT_CLINIC_OR_DEPARTMENT_OTHER): Payer: Self-pay | Admitting: Plastic Surgery

## 2017-09-08 ENCOUNTER — Ambulatory Visit (HOSPITAL_BASED_OUTPATIENT_CLINIC_OR_DEPARTMENT_OTHER): Payer: Medicare Other | Admitting: Anesthesiology

## 2017-09-08 ENCOUNTER — Ambulatory Visit (HOSPITAL_BASED_OUTPATIENT_CLINIC_OR_DEPARTMENT_OTHER)
Admission: RE | Admit: 2017-09-08 | Discharge: 2017-09-08 | Disposition: A | Discharge status: Home / Self Care | Payer: Medicare Other | Source: Ambulatory Visit | Attending: Plastic Surgery | Admitting: Plastic Surgery

## 2017-09-08 ENCOUNTER — Other Ambulatory Visit: Payer: Self-pay

## 2017-09-08 ENCOUNTER — Encounter (HOSPITAL_BASED_OUTPATIENT_CLINIC_OR_DEPARTMENT_OTHER)
Admission: RE | Disposition: A | Discharge status: Home / Self Care | Payer: Self-pay | Source: Ambulatory Visit | Attending: Plastic Surgery

## 2017-09-08 DIAGNOSIS — Z87891 Personal history of nicotine dependence: Secondary | ICD-10-CM | POA: Diagnosis not present

## 2017-09-08 DIAGNOSIS — X58XXXA Exposure to other specified factors, initial encounter: Secondary | ICD-10-CM | POA: Insufficient documentation

## 2017-09-08 DIAGNOSIS — Z6841 Body Mass Index (BMI) 40.0 and over, adult: Secondary | ICD-10-CM | POA: Insufficient documentation

## 2017-09-08 DIAGNOSIS — S81001A Unspecified open wound, right knee, initial encounter: Secondary | ICD-10-CM | POA: Diagnosis not present

## 2017-09-08 HISTORY — PX: DEBRIDEMENT AND CLOSURE WOUND: SHX5614

## 2017-09-08 HISTORY — DX: Pneumonia, unspecified organism: J18.9

## 2017-09-08 HISTORY — DX: Malignant (primary) neoplasm, unspecified: C80.1

## 2017-09-08 HISTORY — DX: Unspecified osteoarthritis, unspecified site: M19.90

## 2017-09-08 LAB — POCT I-STAT, CHEM 8
BUN: 18 mg/dL (ref 6–20)
CALCIUM ION: 1.19 mmol/L (ref 1.15–1.40)
Chloride: 104 mmol/L (ref 101–111)
Creatinine, Ser: 1 mg/dL (ref 0.61–1.24)
Glucose, Bld: 109 mg/dL — ABNORMAL HIGH (ref 65–99)
HCT: 42 % (ref 39.0–52.0)
Hemoglobin: 14.3 g/dL (ref 13.0–17.0)
Potassium: 4.3 mmol/L (ref 3.5–5.1)
SODIUM: 140 mmol/L (ref 135–145)
TCO2: 25 mmol/L (ref 22–32)

## 2017-09-08 SURGERY — DEBRIDEMENT, WOUND, WITH CLOSURE
Anesthesia: General | Site: Knee | Laterality: Right

## 2017-09-08 MED ORDER — FENTANYL CITRATE (PF) 100 MCG/2ML IJ SOLN
INTRAMUSCULAR | Status: AC
  Filled 2017-09-08: qty 2

## 2017-09-08 MED ORDER — DEXAMETHASONE SODIUM PHOSPHATE 4 MG/ML IJ SOLN
INTRAMUSCULAR | Status: DC | PRN
  Administered 2017-09-08: 10 mg via INTRAVENOUS

## 2017-09-08 MED ORDER — EPHEDRINE SULFATE 50 MG/ML IJ SOLN
INTRAMUSCULAR | Status: DC | PRN
  Administered 2017-09-08: 10 mg via INTRAVENOUS

## 2017-09-08 MED ORDER — ONDANSETRON HCL 4 MG/2ML IJ SOLN
INTRAMUSCULAR | Status: DC | PRN
  Administered 2017-09-08: 4 mg via INTRAVENOUS

## 2017-09-08 MED ORDER — EPHEDRINE 5 MG/ML INJ
INTRAVENOUS | Status: AC
  Filled 2017-09-08: qty 10

## 2017-09-08 MED ORDER — LIDOCAINE HCL (CARDIAC) PF 100 MG/5ML IV SOSY
PREFILLED_SYRINGE | INTRAVENOUS | Status: DC | PRN
  Administered 2017-09-08: 100 mg via INTRAVENOUS

## 2017-09-08 MED ORDER — LACTATED RINGERS IV SOLN
INTRAVENOUS | Status: DC
  Administered 2017-09-08 (×2): via INTRAVENOUS

## 2017-09-08 MED ORDER — CEFAZOLIN SODIUM-DEXTROSE 2-4 GM/100ML-% IV SOLN
INTRAVENOUS | Status: AC
  Filled 2017-09-08: qty 100

## 2017-09-08 MED ORDER — LIDOCAINE HCL (CARDIAC) PF 100 MG/5ML IV SOSY
PREFILLED_SYRINGE | INTRAVENOUS | Status: AC
  Filled 2017-09-08: qty 5

## 2017-09-08 MED ORDER — IBUPROFEN 100 MG/5ML PO SUSP: 200.0000 mg | Freq: Four times a day (QID) | ORAL | Status: DC | PRN

## 2017-09-08 MED ORDER — SODIUM CHLORIDE 0.9% FLUSH: 3.0000 mL | INTRAVENOUS | Status: DC | PRN

## 2017-09-08 MED ORDER — BUPIVACAINE-EPINEPHRINE 0.25% -1:200000 IJ SOLN
INTRAMUSCULAR | Status: DC | PRN
  Administered 2017-09-08: 7 mL

## 2017-09-08 MED ORDER — IBUPROFEN 200 MG PO TABS: 200.0000 mg | ORAL_TABLET | Freq: Four times a day (QID) | ORAL | Status: DC | PRN

## 2017-09-08 MED ORDER — FENTANYL CITRATE (PF) 100 MCG/2ML IJ SOLN
50.0000 ug | INTRAMUSCULAR | Status: AC | PRN
  Administered 2017-09-08 (×3): 50 ug via INTRAVENOUS

## 2017-09-08 MED ORDER — ONDANSETRON HCL 4 MG/2ML IJ SOLN
INTRAMUSCULAR | Status: AC
  Filled 2017-09-08: qty 2

## 2017-09-08 MED ORDER — PHENYLEPHRINE 40 MCG/ML (10ML) SYRINGE FOR IV PUSH (FOR BLOOD PRESSURE SUPPORT)
PREFILLED_SYRINGE | INTRAVENOUS | Status: AC
  Filled 2017-09-08: qty 10

## 2017-09-08 MED ORDER — MIDAZOLAM HCL 2 MG/2ML IJ SOLN: 1.0000 mg | INTRAMUSCULAR | Status: DC | PRN

## 2017-09-08 MED ORDER — SODIUM CHLORIDE 0.9% FLUSH: 3.0000 mL | Freq: Two times a day (BID) | INTRAVENOUS | Status: DC

## 2017-09-08 MED ORDER — CEFAZOLIN SODIUM-DEXTROSE 1-4 GM/50ML-% IV SOLN
INTRAVENOUS | Status: AC
  Filled 2017-09-08: qty 50

## 2017-09-08 MED ORDER — ONDANSETRON HCL 4 MG/2ML IJ SOLN: 4.0000 mg | Freq: Once | INTRAMUSCULAR | Status: DC | PRN

## 2017-09-08 MED ORDER — SCOPOLAMINE 1 MG/3DAYS TD PT72: 1.0000 | MEDICATED_PATCH | Freq: Once | TRANSDERMAL | Status: DC | PRN

## 2017-09-08 MED ORDER — DEXTROSE 5 % IV SOLN
3.0000 g | INTRAVENOUS | Status: AC
  Administered 2017-09-08: 3 g via INTRAVENOUS

## 2017-09-08 MED ORDER — OXYCODONE HCL 5 MG PO TABS: 5.0000 mg | ORAL_TABLET | ORAL | Status: DC | PRN

## 2017-09-08 MED ORDER — FENTANYL CITRATE (PF) 100 MCG/2ML IJ SOLN
25.0000 ug | INTRAMUSCULAR | Status: DC | PRN
  Administered 2017-09-08: 25 ug via INTRAVENOUS

## 2017-09-08 MED ORDER — CEFAZOLIN SODIUM-DEXTROSE 2-4 GM/100ML-% IV SOLN: 2.0000 g | INTRAVENOUS | Status: DC

## 2017-09-08 MED ORDER — SUCCINYLCHOLINE CHLORIDE 200 MG/10ML IV SOSY
PREFILLED_SYRINGE | INTRAVENOUS | Status: AC
  Filled 2017-09-08: qty 10

## 2017-09-08 MED ORDER — PROPOFOL 10 MG/ML IV BOLUS
INTRAVENOUS | Status: DC | PRN
  Administered 2017-09-08: 250 mg via INTRAVENOUS

## 2017-09-08 MED ORDER — PROPOFOL 10 MG/ML IV BOLUS
INTRAVENOUS | Status: AC
  Filled 2017-09-08: qty 20

## 2017-09-08 MED ORDER — DEXAMETHASONE SODIUM PHOSPHATE 10 MG/ML IJ SOLN
INTRAMUSCULAR | Status: AC
  Filled 2017-09-08: qty 1

## 2017-09-08 MED ORDER — POLYMYXIN B SULFATE 500000 UNITS IJ SOLR
INTRAMUSCULAR | Status: DC | PRN
  Administered 2017-09-08: 500 mL

## 2017-09-08 MED ORDER — SODIUM CHLORIDE 0.9 % IV SOLN: 250.0000 mL | INTRAVENOUS | Status: DC | PRN

## 2017-09-08 SURGICAL SUPPLY — 66 items
BINDER BREAST 3XL (GAUZE/BANDAGES/DRESSINGS) IMPLANT
BINDER BREAST LRG (GAUZE/BANDAGES/DRESSINGS) IMPLANT
BINDER BREAST MEDIUM (GAUZE/BANDAGES/DRESSINGS) IMPLANT
BINDER BREAST XLRG (GAUZE/BANDAGES/DRESSINGS) IMPLANT
BINDER BREAST XXLRG (GAUZE/BANDAGES/DRESSINGS) IMPLANT
BLADE CLIPPER SURG (BLADE) IMPLANT
BLADE HEX COATED 2.75 (ELECTRODE) ×3 IMPLANT
BLADE SURG 10 STRL SS (BLADE) ×3 IMPLANT
BLADE SURG 15 STRL LF DISP TIS (BLADE) ×1 IMPLANT
BLADE SURG 15 STRL SS (BLADE) ×2
BNDG GAUZE ELAST 4 BULKY (GAUZE/BANDAGES/DRESSINGS) ×6 IMPLANT
CHLORAPREP W/TINT 26ML (MISCELLANEOUS) IMPLANT
COVER BACK TABLE 60X90IN (DRAPES) ×3 IMPLANT
COVER MAYO STAND STRL (DRAPES) ×3 IMPLANT
DECANTER SPIKE VIAL GLASS SM (MISCELLANEOUS) IMPLANT
DERMABOND ADVANCED (GAUZE/BANDAGES/DRESSINGS)
DERMABOND ADVANCED .7 DNX12 (GAUZE/BANDAGES/DRESSINGS) IMPLANT
DRAIN CHANNEL 19F RND (DRAIN) IMPLANT
DRAPE INCISE IOBAN 66X45 STRL (DRAPES) ×3 IMPLANT
DRAPE LAPAROSCOPIC ABDOMINAL (DRAPES) IMPLANT
DRAPE LAPAROTOMY 100X72 PEDS (DRAPES) IMPLANT
DRAPE SURG 17X23 STRL (DRAPES) IMPLANT
DRAPE U-SHAPE 76X120 STRL (DRAPES) ×3 IMPLANT
DRESSING DUODERM 4X4 STERILE (GAUZE/BANDAGES/DRESSINGS) IMPLANT
DRSG ADAPTIC 3X8 NADH LF (GAUZE/BANDAGES/DRESSINGS) ×3 IMPLANT
DRSG EMULSION OIL 3X3 NADH (GAUZE/BANDAGES/DRESSINGS) IMPLANT
DRSG PAD ABDOMINAL 8X10 ST (GAUZE/BANDAGES/DRESSINGS) ×6 IMPLANT
DRSG TEGADERM 4X10 (GAUZE/BANDAGES/DRESSINGS) IMPLANT
DRSG TELFA 3X8 NADH (GAUZE/BANDAGES/DRESSINGS) IMPLANT
ELECT REM PT RETURN 9FT ADLT (ELECTROSURGICAL) ×3
ELECTRODE REM PT RTRN 9FT ADLT (ELECTROSURGICAL) ×1 IMPLANT
EVACUATOR SILICONE 100CC (DRAIN) IMPLANT
GAUZE SPONGE 4X4 12PLY STRL (GAUZE/BANDAGES/DRESSINGS) IMPLANT
GAUZE XEROFORM 5X9 LF (GAUZE/BANDAGES/DRESSINGS) IMPLANT
GLOVE BIO SURGEON STRL SZ 6.5 (GLOVE) ×4 IMPLANT
GLOVE BIO SURGEONS STRL SZ 6.5 (GLOVE) ×2
GOWN STRL REUS W/ TWL LRG LVL3 (GOWN DISPOSABLE) ×2 IMPLANT
GOWN STRL REUS W/TWL LRG LVL3 (GOWN DISPOSABLE) ×4
MATRIX WOUND 3-LAYER 7X10 (Tissue) ×2 IMPLANT
MICROMATRIX 1000MG (Tissue) ×3 IMPLANT
MICROMATRIX 500MG (Tissue) ×6 IMPLANT
NEEDLE HYPO 25X1 1.5 SAFETY (NEEDLE) ×3 IMPLANT
NS IRRIG 1000ML POUR BTL (IV SOLUTION) ×3 IMPLANT
PACK BASIN DAY SURGERY FS (CUSTOM PROCEDURE TRAY) ×3 IMPLANT
PENCIL BUTTON HOLSTER BLD 10FT (ELECTRODE) ×3 IMPLANT
SHEET MEDIUM DRAPE 40X70 STRL (DRAPES) ×3 IMPLANT
SLEEVE SCD COMPRESS KNEE MED (MISCELLANEOUS) ×3 IMPLANT
SOLUTION PARTIC MCRMTRX 1000MG (Tissue) ×1 IMPLANT
SOLUTION PARTIC MCRMTRX 500MG (Tissue) ×2 IMPLANT
SPONGE LAP 18X18 RF (DISPOSABLE) ×6 IMPLANT
STAPLER VISISTAT 35W (STAPLE) IMPLANT
SUT MNCRL AB 3-0 PS2 18 (SUTURE) IMPLANT
SUT MNCRL AB 4-0 PS2 18 (SUTURE) ×6 IMPLANT
SUT MON AB 5-0 PS2 18 (SUTURE) IMPLANT
SUT SILK 3 0 PS 1 (SUTURE) IMPLANT
SWAB COLLECTION DEVICE MRSA (MISCELLANEOUS) IMPLANT
SWAB CULTURE ESWAB REG 1ML (MISCELLANEOUS) IMPLANT
SYR BULB IRRIGATION 50ML (SYRINGE) IMPLANT
SYR CONTROL 10ML LL (SYRINGE) ×3 IMPLANT
TOWEL OR 17X24 6PK STRL BLUE (TOWEL DISPOSABLE) ×3 IMPLANT
TRAY DSU PREP LF (CUSTOM PROCEDURE TRAY) ×6 IMPLANT
TUBE CONNECTING 20'X1/4 (TUBING) ×1
TUBE CONNECTING 20X1/4 (TUBING) ×2 IMPLANT
UNDERPAD 30X30 (UNDERPADS AND DIAPERS) ×3 IMPLANT
WOUND MATRIX 3-LAYER 7X10 (Tissue) ×1 IMPLANT
YANKAUER SUCT BULB TIP NO VENT (SUCTIONS) ×3 IMPLANT

## 2017-09-08 NOTE — Discharge Instructions (Addendum)
Keep VAC in place for one week if possible. Can change twice a week if needed. Follow up in office in one week.   Post Anesthesia Home Care Instructions  Activity: Get plenty of rest for the remainder of the day. A responsible individual must stay with you for 24 hours following the procedure.  For the next 24 hours, DO NOT: -Drive a car -Paediatric nurse -Drink alcoholic beverages -Take any medication unless instructed by your physician -Make any legal decisions or sign important papers.  Meals: Start with liquid foods such as gelatin or soup. Progress to regular foods as tolerated. Avoid greasy, spicy, heavy foods. If nausea and/or vomiting occur, drink only clear liquids until the nausea and/or vomiting subsides. Call your physician if vomiting continues.  Special Instructions/Symptoms: Your throat may feel dry or sore from the anesthesia or the breathing tube placed in your throat during surgery. If this causes discomfort, gargle with warm salt water. The discomfort should disappear within 24 hours.  If you had a scopolamine patch placed behind your ear for the management of post- operative nausea and/or vomiting:  1. The medication in the patch is effective for 72 hours, after which it should be removed.  Wrap patch in a tissue and discard in the trash. Wash hands thoroughly with soap and water. 2. You may remove the patch earlier than 72 hours if you experience unpleasant side effects which may include dry mouth, dizziness or visual disturbances. 3. Avoid touching the patch. Wash your hands with soap and water after contact with the patch.

## 2017-09-08 NOTE — Transfer of Care (Signed)
Immediate Anesthesia Transfer of Care Note  Patient: Clinton Shea.  Procedure(s) Performed: RIGHT KNEE WOUND EXCISION WITH PLACEMENT OF ACELL AND VAC (Right Knee)  Patient Location: PACU  Anesthesia Type:General  Level of Consciousness: awake, alert  and oriented  Airway & Oxygen Therapy: Patient Spontanous Breathing and Patient connected to face mask oxygen  Post-op Assessment: Report given to RN and Post -op Vital signs reviewed and stable  Post vital signs: Reviewed and stable  Last Vitals:  Vitals Value Taken Time  BP    Temp    Pulse 81 09/08/2017  1:17 PM  Resp 12 09/08/2017  1:17 PM  SpO2 100 % 09/08/2017  1:17 PM  Vitals shown include unvalidated device data.  Last Pain:  Vitals:   09/08/17 1038  TempSrc: Oral  PainSc: 3       Patients Stated Pain Goal: 3 (11/55/20 8022)  Complications: No apparent anesthesia complications

## 2017-09-08 NOTE — Interval H&P Note (Signed)
History and Physical Interval Note:  09/08/2017 10:19 AM  Clinton Shea.  has presented today for surgery, with the diagnosis of open wound of right knee with complication  The various methods of treatment have been discussed with the patient and family. After consideration of risks, benefits and other options for treatment, the patient has consented to  Procedure(s): RIGHT KNEE WOUND EXCISION WITH PLACEMENT OF ACELL AND VAC (Right) as a surgical intervention .  The patient's history has been reviewed, patient examined, no change in status, stable for surgery.  I have reviewed the patient's chart and labs.  Questions were answered to the patient's satisfaction.     Loel Lofty Dillingham

## 2017-09-08 NOTE — Op Note (Signed)
DATE OF OPERATION: 09/08/2017  LOCATION: [facility]  PREOPERATIVE DIAGNOSIS: right knee wound  POSTOPERATIVE DIAGNOSIS: Same  PROCEDURE: Excision of right knee wound 15 x 15 cm with placement of Acell (7 x 10 cm sheet and 2 gm Acell) and VAC placement  SURGEON: Ondine Gemme Sanger Wendle Kina, DO  EBL: 20 cc  CONDITION: Stable  COMPLICATIONS: None  INDICATION: The patient, Clinton Shea, is a 84 y.o. male born on Jan 14, 1933, is here for treatment of a chronic right knee wound with seroma and hematoma.   PROCEDURE DETAILS:  The patient was seen prior to surgery and marked.  The IV antibiotics were given. The patient was taken to the operating room and given a general anesthetic. A standard time out was performed and all information was confirmed by those in the room. SCD was placed on the left leg.   The leg was prepped and draped.  The local was injected around the previous incision site.  The #10 blade was used to open the distal portion of the incision.  There was ~ 350 cc of serosanginous fluid removed.  There was a clear capsule noted.  The decision was made to open the entire incision.  The wound was 15 x 15 cm with a very thick but clean capsule deep and superficial.  The superficial capsule was excised.  Hemostasis was achieved with electrocautery.  The deep capsule was debrided with the curette and the #10 blade.  The pocket was irrigated with saline and antibiotic solution.  The superior portion of the incision was closed with the 3-0 Monocryl with vertical mattress suture.  All of the acell powder and sheet was applied.  The adaptic was placed with KY gel.  The VAC was placed and there was an excellent seal.  The leg was wrapped with kerlex and an Ace wrap.  The patient was allowed to wake up and taken to recovery room in stable condition at the end of the case. The family was notified at the end of the case.

## 2017-09-08 NOTE — H&P (Signed)
Clinton Shea. is an 84 y.o. male.   Chief Complaint: right knee wound HPI: The patient is a 84 y.o. yrs old wm here with daughter and son-in-law for pre operative history and physical prior to excision and Acell and VAC placement to a right knee wound.  History:  He fell at home several months ago and developed a large seroma.  This was aspiration by Dr. Samule Shea with 300 cc removed.  He reaccumulated a few weeks later but it was too thick to aspirate therefore underwent a bursectomy 04/2017 with VAC placement.  He is now in a cycle of the seroma filling the pocket and creating immense pressure on the knee.  The skin is thickened.  There is no redness or sign of infection at this time.  I removed ~ 300 cc of serousanginous fluid today.  This helped a great deal.  There is no cellulitis but he has chronic swelling of the lower legs with hemosiderosis staining and vascular insufficiency by exam.  He has gone back to using compression and this is helping some.   Past Medical History:  Diagnosis Date  . Arthritis   . Cancer ([place]) 2013  [facility] [place]   Prostate  . Pneumonia    childhood    Past Surgical History:  Procedure Laterality Date  . KNEE SURGERY Right 04/21/2017   remove bursa  . PROSTATECTOMY  2013    History reviewed. No pertinent family history. Social History:  reports that he quit smoking about 48 years ago. His smoking use included cigarettes. He has never used smokeless tobacco. He reports that he drank alcohol. He reports that he does not use drugs.  Allergies:  Allergies  Allergen Reactions  . Tylenol [Acetaminophen]     Severe indigestion    No medications prior to admission.    No results found for this or any previous visit (from the past 48 hour(s)). No results found.  Review of Systems  Constitutional: Negative.   HENT: Negative.   Eyes: Negative.   Cardiovascular: Negative.   Gastrointestinal: Negative.   Genitourinary: Negative.   Musculoskeletal:  Positive for joint pain.  Skin: Negative.   Neurological: Negative.   Psychiatric/Behavioral: Negative.     Height 6\' 3"  (1.905 m), weight (!) 145.6 kg (321 lb). Physical Exam  Constitutional: He is oriented to person, place, and time. He appears well-developed and well-nourished.  HENT:  Head: Normocephalic and atraumatic.  Eyes: Pupils are equal, round, and reactive to light.  Cardiovascular: Normal rate.  Respiratory: Effort normal. No respiratory distress.  GI: Soft.  Musculoskeletal: He exhibits edema and tenderness.  Neurological: He is alert and oriented to person, place, and time.  Skin: Skin is warm. No rash noted. No erythema.  Psychiatric: He has a normal mood and affect. His behavior is normal. Judgment and thought content normal.     Assessment/Plan Recommend excision of the right knee wound / capsule with acell and Vac placement. Basye, DO 09/08/2017, 7:45 AM

## 2017-09-08 NOTE — Anesthesia Postprocedure Evaluation (Signed)
Anesthesia Post Note  Patient: Clinton Shea.  Procedure(s) Performed: RIGHT KNEE WOUND EXCISION WITH PLACEMENT OF ACELL AND VAC (Right Knee)     Patient location during evaluation: PACU Anesthesia Type: General Level of consciousness: awake and alert Pain management: pain level controlled Vital Signs Assessment: post-procedure vital signs reviewed and stable Respiratory status: spontaneous breathing, nonlabored ventilation and respiratory function stable Cardiovascular status: blood pressure returned to baseline and stable Postop Assessment: no apparent nausea or vomiting Anesthetic complications: no    Last Vitals:  Vitals:   09/08/17 1318 09/08/17 1330  BP: (!) 142/75 127/69  Pulse:  76  Resp:  20  Temp:    SpO2:  97%    Last Pain:  Vitals:   09/08/17 1315  TempSrc:   PainSc: 4                  Catalina Gravel

## 2017-09-08 NOTE — Anesthesia Procedure Notes (Signed)
Procedure Name: LMA Insertion Date/Time: 09/08/2017 12:08 PM Performed by: Maryella Shivers, CRNA Pre-anesthesia Checklist: Patient identified, Emergency Drugs available, Suction available and Patient being monitored Patient Re-evaluated:Patient Re-evaluated prior to induction Oxygen Delivery Method: Circle system utilized Preoxygenation: Pre-oxygenation with 100% oxygen Induction Type: IV induction Ventilation: Mask ventilation without difficulty LMA: LMA inserted LMA Size: 5.0 Number of attempts: 1 Airway Equipment and Method: Bite block Placement Confirmation: positive ETCO2 Tube secured with: Tape Dental Injury: Teeth and Oropharynx as per pre-operative assessment

## 2017-09-08 NOTE — Anesthesia Preprocedure Evaluation (Addendum)
Anesthesia Evaluation  Patient identified by MRN, date of birth, ID band Patient awake    Reviewed: Allergy & Precautions, NPO status , Patient's Chart, lab work & pertinent test results  Airway Mallampati: II  TM Distance: >3 FB Neck ROM: Full    Dental  (+) Poor Dentition, Edentulous Upper   Pulmonary former smoker,    Pulmonary exam normal breath sounds clear to auscultation       Cardiovascular Exercise Tolerance: Good negative cardio ROS Normal cardiovascular exam Rhythm:Regular Rate:Normal     Neuro/Psych negative neurological ROS  negative psych ROS   GI/Hepatic Neg liver ROS,   Endo/Other  Morbid obesity  Renal/GU negative Renal ROS     Musculoskeletal negative musculoskeletal ROS (+)   Abdominal (+) + obese,   Peds  Hematology negative hematology ROS (+)   Anesthesia Other Findings Pt is Allergic to acetaminophen  Reproductive/Obstetrics                           Anesthesia Physical Anesthesia Plan  ASA: III  Anesthesia Plan: General   Post-op Pain Management:    Induction: Intravenous  PONV Risk Score and Plan: Treatment may vary due to age or medical condition  Airway Management Planned: LMA  Additional Equipment:   Intra-op Plan:   Post-operative Plan:   Informed Consent: I have reviewed the patients History and Physical, chart, labs and discussed the procedure including the risks, benefits and alternatives for the proposed anesthesia with the patient or authorized representative who has indicated his/her understanding and acceptance.   Dental advisory given  Plan Discussed with: CRNA  Anesthesia Plan Comments:         Anesthesia Quick Evaluation

## 2017-09-09 ENCOUNTER — Encounter (HOSPITAL_BASED_OUTPATIENT_CLINIC_OR_DEPARTMENT_OTHER): Payer: Self-pay | Admitting: Plastic Surgery

## 2017-09-15 DIAGNOSIS — S81001D Unspecified open wound, right knee, subsequent encounter: Secondary | ICD-10-CM | POA: Diagnosis not present

## 2017-09-22 DIAGNOSIS — S81001D Unspecified open wound, right knee, subsequent encounter: Secondary | ICD-10-CM | POA: Diagnosis not present

## 2017-09-28 DIAGNOSIS — S81001D Unspecified open wound, right knee, subsequent encounter: Secondary | ICD-10-CM | POA: Diagnosis not present

## 2017-09-30 DIAGNOSIS — S81001D Unspecified open wound, right knee, subsequent encounter: Secondary | ICD-10-CM | POA: Diagnosis not present

## 2017-09-30 DIAGNOSIS — G8929 Other chronic pain: Secondary | ICD-10-CM | POA: Diagnosis not present

## 2017-09-30 DIAGNOSIS — M545 Low back pain: Secondary | ICD-10-CM | POA: Diagnosis not present

## 2017-09-30 DIAGNOSIS — Z79891 Long term (current) use of opiate analgesic: Secondary | ICD-10-CM | POA: Diagnosis not present

## 2017-09-30 DIAGNOSIS — Z9181 History of falling: Secondary | ICD-10-CM | POA: Diagnosis not present

## 2017-10-02 DIAGNOSIS — Z9181 History of falling: Secondary | ICD-10-CM | POA: Diagnosis not present

## 2017-10-02 DIAGNOSIS — S81001D Unspecified open wound, right knee, subsequent encounter: Secondary | ICD-10-CM | POA: Diagnosis not present

## 2017-10-02 DIAGNOSIS — Z79891 Long term (current) use of opiate analgesic: Secondary | ICD-10-CM | POA: Diagnosis not present

## 2017-10-02 DIAGNOSIS — G8929 Other chronic pain: Secondary | ICD-10-CM | POA: Diagnosis not present

## 2017-10-02 DIAGNOSIS — M545 Low back pain: Secondary | ICD-10-CM | POA: Diagnosis not present

## 2017-10-05 DIAGNOSIS — S81001D Unspecified open wound, right knee, subsequent encounter: Secondary | ICD-10-CM | POA: Diagnosis not present

## 2017-10-05 DIAGNOSIS — Z79891 Long term (current) use of opiate analgesic: Secondary | ICD-10-CM | POA: Diagnosis not present

## 2017-10-05 DIAGNOSIS — M545 Low back pain: Secondary | ICD-10-CM | POA: Diagnosis not present

## 2017-10-05 DIAGNOSIS — Z9181 History of falling: Secondary | ICD-10-CM | POA: Diagnosis not present

## 2017-10-05 DIAGNOSIS — G8929 Other chronic pain: Secondary | ICD-10-CM | POA: Diagnosis not present

## 2017-10-08 DIAGNOSIS — Z79891 Long term (current) use of opiate analgesic: Secondary | ICD-10-CM | POA: Diagnosis not present

## 2017-10-08 DIAGNOSIS — M545 Low back pain: Secondary | ICD-10-CM | POA: Diagnosis not present

## 2017-10-08 DIAGNOSIS — G8929 Other chronic pain: Secondary | ICD-10-CM | POA: Diagnosis not present

## 2017-10-08 DIAGNOSIS — Z9181 History of falling: Secondary | ICD-10-CM | POA: Diagnosis not present

## 2017-10-08 DIAGNOSIS — S81001D Unspecified open wound, right knee, subsequent encounter: Secondary | ICD-10-CM | POA: Diagnosis not present

## 2017-10-11 DIAGNOSIS — Z79891 Long term (current) use of opiate analgesic: Secondary | ICD-10-CM | POA: Diagnosis not present

## 2017-10-11 DIAGNOSIS — Z9181 History of falling: Secondary | ICD-10-CM | POA: Diagnosis not present

## 2017-10-11 DIAGNOSIS — S81001D Unspecified open wound, right knee, subsequent encounter: Secondary | ICD-10-CM | POA: Diagnosis not present

## 2017-10-11 DIAGNOSIS — G8929 Other chronic pain: Secondary | ICD-10-CM | POA: Diagnosis not present

## 2017-10-11 DIAGNOSIS — M545 Low back pain: Secondary | ICD-10-CM | POA: Diagnosis not present

## 2017-10-15 DIAGNOSIS — S81001D Unspecified open wound, right knee, subsequent encounter: Secondary | ICD-10-CM | POA: Diagnosis not present

## 2017-10-19 DIAGNOSIS — M545 Low back pain: Secondary | ICD-10-CM | POA: Diagnosis not present

## 2017-10-19 DIAGNOSIS — Z79891 Long term (current) use of opiate analgesic: Secondary | ICD-10-CM | POA: Diagnosis not present

## 2017-10-19 DIAGNOSIS — G8929 Other chronic pain: Secondary | ICD-10-CM | POA: Diagnosis not present

## 2017-10-19 DIAGNOSIS — S81001D Unspecified open wound, right knee, subsequent encounter: Secondary | ICD-10-CM | POA: Diagnosis not present

## 2017-10-19 DIAGNOSIS — Z9181 History of falling: Secondary | ICD-10-CM | POA: Diagnosis not present

## 2017-10-20 DIAGNOSIS — Z79891 Long term (current) use of opiate analgesic: Secondary | ICD-10-CM | POA: Diagnosis not present

## 2017-10-20 DIAGNOSIS — M545 Low back pain: Secondary | ICD-10-CM | POA: Diagnosis not present

## 2017-10-20 DIAGNOSIS — S81001D Unspecified open wound, right knee, subsequent encounter: Secondary | ICD-10-CM | POA: Diagnosis not present

## 2017-10-20 DIAGNOSIS — Z9181 History of falling: Secondary | ICD-10-CM | POA: Diagnosis not present

## 2017-10-20 DIAGNOSIS — G8929 Other chronic pain: Secondary | ICD-10-CM | POA: Diagnosis not present

## 2017-10-22 DIAGNOSIS — G8929 Other chronic pain: Secondary | ICD-10-CM | POA: Diagnosis not present

## 2017-10-22 DIAGNOSIS — S81001D Unspecified open wound, right knee, subsequent encounter: Secondary | ICD-10-CM | POA: Diagnosis not present

## 2017-10-22 DIAGNOSIS — Z79891 Long term (current) use of opiate analgesic: Secondary | ICD-10-CM | POA: Diagnosis not present

## 2017-10-22 DIAGNOSIS — Z9181 History of falling: Secondary | ICD-10-CM | POA: Diagnosis not present

## 2017-10-22 DIAGNOSIS — M545 Low back pain: Secondary | ICD-10-CM | POA: Diagnosis not present

## 2017-10-26 DIAGNOSIS — M545 Low back pain: Secondary | ICD-10-CM | POA: Diagnosis not present

## 2017-10-26 DIAGNOSIS — Z9181 History of falling: Secondary | ICD-10-CM | POA: Diagnosis not present

## 2017-10-26 DIAGNOSIS — Z79891 Long term (current) use of opiate analgesic: Secondary | ICD-10-CM | POA: Diagnosis not present

## 2017-10-26 DIAGNOSIS — S81001D Unspecified open wound, right knee, subsequent encounter: Secondary | ICD-10-CM | POA: Diagnosis not present

## 2017-10-26 DIAGNOSIS — G8929 Other chronic pain: Secondary | ICD-10-CM | POA: Diagnosis not present

## 2017-10-28 DIAGNOSIS — M545 Low back pain: Secondary | ICD-10-CM | POA: Diagnosis not present

## 2017-10-28 DIAGNOSIS — S81001D Unspecified open wound, right knee, subsequent encounter: Secondary | ICD-10-CM | POA: Diagnosis not present

## 2017-10-28 DIAGNOSIS — G8929 Other chronic pain: Secondary | ICD-10-CM | POA: Diagnosis not present

## 2017-10-28 DIAGNOSIS — Z9181 History of falling: Secondary | ICD-10-CM | POA: Diagnosis not present

## 2017-10-28 DIAGNOSIS — Z79891 Long term (current) use of opiate analgesic: Secondary | ICD-10-CM | POA: Diagnosis not present

## 2017-11-02 DIAGNOSIS — M545 Low back pain: Secondary | ICD-10-CM | POA: Diagnosis not present

## 2017-11-02 DIAGNOSIS — G8929 Other chronic pain: Secondary | ICD-10-CM | POA: Diagnosis not present

## 2017-11-02 DIAGNOSIS — Z79891 Long term (current) use of opiate analgesic: Secondary | ICD-10-CM | POA: Diagnosis not present

## 2017-11-02 DIAGNOSIS — Z9181 History of falling: Secondary | ICD-10-CM | POA: Diagnosis not present

## 2017-11-02 DIAGNOSIS — S81001D Unspecified open wound, right knee, subsequent encounter: Secondary | ICD-10-CM | POA: Diagnosis not present

## 2017-11-05 DIAGNOSIS — S81001D Unspecified open wound, right knee, subsequent encounter: Secondary | ICD-10-CM | POA: Diagnosis not present

## 2017-11-08 DIAGNOSIS — Z79891 Long term (current) use of opiate analgesic: Secondary | ICD-10-CM | POA: Diagnosis not present

## 2017-11-08 DIAGNOSIS — Z9181 History of falling: Secondary | ICD-10-CM | POA: Diagnosis not present

## 2017-11-08 DIAGNOSIS — G8929 Other chronic pain: Secondary | ICD-10-CM | POA: Diagnosis not present

## 2017-11-08 DIAGNOSIS — S81001D Unspecified open wound, right knee, subsequent encounter: Secondary | ICD-10-CM | POA: Diagnosis not present

## 2017-11-08 DIAGNOSIS — M545 Low back pain: Secondary | ICD-10-CM | POA: Diagnosis not present

## 2017-11-09 ENCOUNTER — Other Ambulatory Visit: Payer: Self-pay

## 2017-11-09 ENCOUNTER — Encounter (HOSPITAL_COMMUNITY): Payer: Self-pay | Admitting: *Deleted

## 2017-11-10 ENCOUNTER — Ambulatory Visit (HOSPITAL_COMMUNITY): Payer: Medicare Other | Admitting: Certified Registered Nurse Anesthetist

## 2017-11-10 ENCOUNTER — Encounter (HOSPITAL_COMMUNITY): Payer: Self-pay | Admitting: Urology

## 2017-11-10 ENCOUNTER — Ambulatory Visit: Payer: Self-pay | Admitting: Plastic Surgery

## 2017-11-10 ENCOUNTER — Ambulatory Visit (HOSPITAL_COMMUNITY)
Admission: RE | Admit: 2017-11-10 | Discharge: 2017-11-10 | Disposition: A | Discharge status: Home / Self Care | Payer: Medicare Other | Source: Ambulatory Visit | Attending: Plastic Surgery | Admitting: Plastic Surgery

## 2017-11-10 ENCOUNTER — Encounter (HOSPITAL_COMMUNITY)
Admission: RE | Disposition: A | Discharge status: Home / Self Care | Payer: Self-pay | Source: Ambulatory Visit | Attending: Plastic Surgery

## 2017-11-10 DIAGNOSIS — B9562 Methicillin resistant Staphylococcus aureus infection as the cause of diseases classified elsewhere: Secondary | ICD-10-CM | POA: Insufficient documentation

## 2017-11-10 DIAGNOSIS — S91001D Unspecified open wound, right ankle, subsequent encounter: Secondary | ICD-10-CM

## 2017-11-10 DIAGNOSIS — Y92009 Unspecified place in unspecified non-institutional (private) residence as the place of occurrence of the external cause: Secondary | ICD-10-CM | POA: Diagnosis not present

## 2017-11-10 DIAGNOSIS — S81801D Unspecified open wound, right lower leg, subsequent encounter: Principal | ICD-10-CM

## 2017-11-10 DIAGNOSIS — T8132XA Disruption of internal operation (surgical) wound, not elsewhere classified, initial encounter: Secondary | ICD-10-CM | POA: Diagnosis not present

## 2017-11-10 DIAGNOSIS — Y838 Other surgical procedures as the cause of abnormal reaction of the patient, or of later complication, without mention of misadventure at the time of the procedure: Secondary | ICD-10-CM | POA: Diagnosis not present

## 2017-11-10 DIAGNOSIS — S81001A Unspecified open wound, right knee, initial encounter: Secondary | ICD-10-CM | POA: Diagnosis not present

## 2017-11-10 DIAGNOSIS — S81001D Unspecified open wound, right knee, subsequent encounter: Secondary | ICD-10-CM

## 2017-11-10 HISTORY — DX: Dizziness and giddiness: R42

## 2017-11-10 HISTORY — DX: Dyspnea, unspecified: R06.00

## 2017-11-10 HISTORY — PX: INCISION AND DRAINAGE OF WOUND: SHX1803

## 2017-11-10 HISTORY — DX: Cellulitis, unspecified: L03.90

## 2017-11-10 LAB — BASIC METABOLIC PANEL
ANION GAP: 13 (ref 5–15)
BUN: 38 mg/dL — AB (ref 8–23)
CALCIUM: 8.9 mg/dL (ref 8.9–10.3)
CO2: 21 mmol/L — AB (ref 22–32)
Chloride: 102 mmol/L (ref 98–111)
Creatinine, Ser: 2.6 mg/dL — ABNORMAL HIGH (ref 0.61–1.24)
GFR calc Af Amer: 25 mL/min — ABNORMAL LOW (ref >60)
GFR calc non Af Amer: 21 mL/min — ABNORMAL LOW (ref >60)
GLUCOSE: 150 mg/dL — AB (ref 70–99)
POTASSIUM: 4.4 mmol/L (ref 3.5–5.1)
Sodium: 136 mmol/L (ref 135–145)

## 2017-11-10 LAB — CBC
HEMATOCRIT: 39.5 % (ref 39.0–52.0)
Hemoglobin: 12.9 g/dL — ABNORMAL LOW (ref 13.0–17.0)
MCH: 30.1 pg (ref 26.0–34.0)
MCHC: 32.7 g/dL (ref 30.0–36.0)
MCV: 92.3 fL (ref 78.0–100.0)
Platelets: 312 10*3/uL (ref 150–400)
RBC: 4.28 MIL/uL (ref 4.22–5.81)
RDW: 11.9 % (ref 11.5–15.5)
WBC: 14.3 10*3/uL — AB (ref 4.0–10.5)

## 2017-11-10 SURGERY — IRRIGATION AND DEBRIDEMENT WOUND
Anesthesia: General | Site: Knee | Laterality: Right

## 2017-11-10 MED ORDER — SODIUM CHLORIDE 0.9 % IV SOLN: 250.0000 mL | INTRAVENOUS | Status: DC | PRN

## 2017-11-10 MED ORDER — DEXAMETHASONE SODIUM PHOSPHATE 10 MG/ML IJ SOLN
INTRAMUSCULAR | Status: DC | PRN
  Administered 2017-11-10: 10 mg via INTRAVENOUS

## 2017-11-10 MED ORDER — SODIUM CHLORIDE 0.9% FLUSH: 3.0000 mL | INTRAVENOUS | Status: DC | PRN

## 2017-11-10 MED ORDER — CEFAZOLIN SODIUM-DEXTROSE 2-4 GM/100ML-% IV SOLN
2.0000 g | INTRAVENOUS | Status: AC
  Administered 2017-11-10: 2 g via INTRAVENOUS
  Filled 2017-11-10: qty 100

## 2017-11-10 MED ORDER — HYDROMORPHONE HCL 1 MG/ML IJ SOLN: 0.2500 mg | INTRAMUSCULAR | Status: DC | PRN

## 2017-11-10 MED ORDER — SODIUM CHLORIDE 0.9% FLUSH: 3.0000 mL | Freq: Two times a day (BID) | INTRAVENOUS | Status: DC

## 2017-11-10 MED ORDER — OXYCODONE HCL 5 MG/5ML PO SOLN: 5.0000 mg | Freq: Once | ORAL | Status: DC | PRN

## 2017-11-10 MED ORDER — LIDOCAINE-EPINEPHRINE 1 %-1:100000 IJ SOLN
INTRAMUSCULAR | Status: DC | PRN
  Administered 2017-11-10: 6 mL

## 2017-11-10 MED ORDER — SODIUM CHLORIDE 0.9 % IV SOLN
INTRAVENOUS | Status: AC
  Filled 2017-11-10: qty 500000

## 2017-11-10 MED ORDER — 0.9 % SODIUM CHLORIDE (POUR BTL) OPTIME
TOPICAL | Status: DC | PRN
  Administered 2017-11-10: 1000 mL

## 2017-11-10 MED ORDER — DEXAMETHASONE SODIUM PHOSPHATE 10 MG/ML IJ SOLN
INTRAMUSCULAR | Status: AC
  Filled 2017-11-10: qty 1

## 2017-11-10 MED ORDER — FENTANYL CITRATE (PF) 250 MCG/5ML IJ SOLN
INTRAMUSCULAR | Status: AC
  Filled 2017-11-10: qty 5

## 2017-11-10 MED ORDER — OXYCODONE HCL 5 MG PO TABS: 5.0000 mg | ORAL_TABLET | ORAL | Status: DC | PRN

## 2017-11-10 MED ORDER — SODIUM CHLORIDE 0.9 % IV SOLN
INTRAVENOUS | Status: DC | PRN
  Administered 2017-11-10: 500 mL

## 2017-11-10 MED ORDER — PROPOFOL 10 MG/ML IV BOLUS
INTRAVENOUS | Status: AC
  Filled 2017-11-10: qty 20

## 2017-11-10 MED ORDER — LIDOCAINE-EPINEPHRINE 1 %-1:100000 IJ SOLN
INTRAMUSCULAR | Status: AC
  Filled 2017-11-10: qty 1

## 2017-11-10 MED ORDER — LACTATED RINGERS IV SOLN
INTRAVENOUS | Status: DC
  Administered 2017-11-10 (×2): via INTRAVENOUS

## 2017-11-10 MED ORDER — PROMETHAZINE HCL 25 MG/ML IJ SOLN: 6.2500 mg | INTRAMUSCULAR | Status: DC | PRN

## 2017-11-10 MED ORDER — PHENYLEPHRINE 40 MCG/ML (10ML) SYRINGE FOR IV PUSH (FOR BLOOD PRESSURE SUPPORT)
PREFILLED_SYRINGE | INTRAVENOUS | Status: AC
  Filled 2017-11-10: qty 10

## 2017-11-10 MED ORDER — ACETAMINOPHEN 650 MG RE SUPP: 650.0000 mg | RECTAL | Status: DC | PRN

## 2017-11-10 MED ORDER — FENTANYL CITRATE (PF) 100 MCG/2ML IJ SOLN
INTRAMUSCULAR | Status: DC | PRN
  Administered 2017-11-10 (×4): 25 ug via INTRAVENOUS

## 2017-11-10 MED ORDER — OXYCODONE HCL 5 MG PO TABS: 5.0000 mg | ORAL_TABLET | Freq: Once | ORAL | Status: DC | PRN

## 2017-11-10 MED ORDER — LIDOCAINE 2% (20 MG/ML) 5 ML SYRINGE
INTRAMUSCULAR | Status: DC | PRN
  Administered 2017-11-10: 100 mg via INTRAVENOUS

## 2017-11-10 MED ORDER — PHENYLEPHRINE 40 MCG/ML (10ML) SYRINGE FOR IV PUSH (FOR BLOOD PRESSURE SUPPORT)
PREFILLED_SYRINGE | INTRAVENOUS | Status: DC | PRN
  Administered 2017-11-10: 120 ug via INTRAVENOUS
  Administered 2017-11-10: 80 ug via INTRAVENOUS
  Administered 2017-11-10 (×2): 120 ug via INTRAVENOUS
  Administered 2017-11-10: 80 ug via INTRAVENOUS

## 2017-11-10 MED ORDER — PROPOFOL 10 MG/ML IV BOLUS
INTRAVENOUS | Status: DC | PRN
  Administered 2017-11-10: 200 mg via INTRAVENOUS

## 2017-11-10 MED ORDER — LIDOCAINE 2% (20 MG/ML) 5 ML SYRINGE
INTRAMUSCULAR | Status: AC
  Filled 2017-11-10: qty 5

## 2017-11-10 MED ORDER — ACETAMINOPHEN 325 MG PO TABS: 650.0000 mg | ORAL_TABLET | ORAL | Status: DC | PRN

## 2017-11-10 MED ORDER — ONDANSETRON HCL 4 MG/2ML IJ SOLN
INTRAMUSCULAR | Status: AC
  Filled 2017-11-10: qty 2

## 2017-11-10 MED ORDER — ONDANSETRON HCL 4 MG/2ML IJ SOLN
INTRAMUSCULAR | Status: DC | PRN
  Administered 2017-11-10: 4 mg via INTRAVENOUS

## 2017-11-10 SURGICAL SUPPLY — 51 items
APPLICATOR COTTON TIP 6IN STRL (MISCELLANEOUS) IMPLANT
BAG DECANTER FOR FLEXI CONT (MISCELLANEOUS) IMPLANT
BANDAGE ACE 6X5 VEL STRL LF (GAUZE/BANDAGES/DRESSINGS) ×1 IMPLANT
BENZOIN TINCTURE PRP APPL 2/3 (GAUZE/BANDAGES/DRESSINGS) ×2 IMPLANT
BNDG GAUZE ELAST 4 BULKY (GAUZE/BANDAGES/DRESSINGS) ×1 IMPLANT
CANISTER SUCT 3000ML PPV (MISCELLANEOUS) ×2 IMPLANT
CONT SPEC 4OZ CLIKSEAL STRL BL (MISCELLANEOUS) IMPLANT
COVER SURGICAL LIGHT HANDLE (MISCELLANEOUS) ×2 IMPLANT
DRAPE HALF SHEET 40X57 (DRAPES) IMPLANT
DRAPE IMP U-DRAPE 54X76 (DRAPES) ×2 IMPLANT
DRAPE INCISE IOBAN 66X45 STRL (DRAPES) IMPLANT
DRAPE LAPAROSCOPIC ABDOMINAL (DRAPES) IMPLANT
DRAPE LAPAROTOMY 100X72 PEDS (DRAPES) ×2 IMPLANT
DRESSING HYDROCOLLOID 4X4 (GAUZE/BANDAGES/DRESSINGS) ×2 IMPLANT
DRESSING PREVENA PLUS CUSTOM (GAUZE/BANDAGES/DRESSINGS) IMPLANT
DRSG ADAPTIC 3X8 NADH LF (GAUZE/BANDAGES/DRESSINGS) IMPLANT
DRSG CUTIMED SORBACT 7X9 (GAUZE/BANDAGES/DRESSINGS) ×1 IMPLANT
DRSG PAD ABDOMINAL 8X10 ST (GAUZE/BANDAGES/DRESSINGS) IMPLANT
DRSG PREVENA PLUS CUSTOM (GAUZE/BANDAGES/DRESSINGS) ×2
DRSG VAC ATS LRG SENSATRAC (GAUZE/BANDAGES/DRESSINGS) IMPLANT
DRSG VAC ATS MED SENSATRAC (GAUZE/BANDAGES/DRESSINGS) IMPLANT
DRSG VAC ATS SM SENSATRAC (GAUZE/BANDAGES/DRESSINGS) IMPLANT
ELECT CAUTERY BLADE 6.4 (BLADE) IMPLANT
ELECT REM PT RETURN 9FT ADLT (ELECTROSURGICAL) ×2
ELECTRODE REM PT RTRN 9FT ADLT (ELECTROSURGICAL) ×1 IMPLANT
GAUZE SPONGE 4X4 12PLY STRL (GAUZE/BANDAGES/DRESSINGS) IMPLANT
GLOVE BIO SURGEON STRL SZ 6.5 (GLOVE) ×2 IMPLANT
GOWN STRL REUS W/ TWL LRG LVL3 (GOWN DISPOSABLE) ×3 IMPLANT
GOWN STRL REUS W/TWL LRG LVL3 (GOWN DISPOSABLE) ×3
KIT BASIN OR (CUSTOM PROCEDURE TRAY) ×2 IMPLANT
KIT DRSG PREVENA PLUS 7DAY 125 (MISCELLANEOUS) ×1 IMPLANT
KIT PREVENA INCISION MGT 13 (CANNISTER) ×1 IMPLANT
KIT TURNOVER KIT B (KITS) ×2 IMPLANT
MATRIX WOUND 3-LAYER 5X5 (Tissue) ×1 IMPLANT
MICROMATRIX 1000MG (Tissue) ×2 IMPLANT
NDL HYPO 25GX1X1/2 BEV (NEEDLE) ×1 IMPLANT
NEEDLE HYPO 25GX1X1/2 BEV (NEEDLE) ×2 IMPLANT
NS IRRIG 1000ML POUR BTL (IV SOLUTION) ×2 IMPLANT
PACK GENERAL/GYN (CUSTOM PROCEDURE TRAY) ×2 IMPLANT
PACK UNIVERSAL I (CUSTOM PROCEDURE TRAY) ×2 IMPLANT
PAD ARMBOARD 7.5X6 YLW CONV (MISCELLANEOUS) ×4 IMPLANT
SOLUTION PARTIC MCRMTRX 1000MG (Tissue) IMPLANT
STAPLER VISISTAT 35W (STAPLE) ×2 IMPLANT
SURGILUBE 2OZ TUBE FLIPTOP (MISCELLANEOUS) IMPLANT
SUT MNCRL AB 4-0 PS2 18 (SUTURE) IMPLANT
SUT VIC AB 5-0 PS2 18 (SUTURE) IMPLANT
SWAB COLLECTION DEVICE MRSA (MISCELLANEOUS) IMPLANT
SWAB CULTURE ESWAB REG 1ML (MISCELLANEOUS) IMPLANT
SYR CONTROL 10ML LL (SYRINGE) ×2 IMPLANT
TOWEL OR 17X26 10 PK STRL BLUE (TOWEL DISPOSABLE) ×2 IMPLANT
UNDERPAD 30X30 (UNDERPADS AND DIAPERS) ×2 IMPLANT

## 2017-11-10 NOTE — Discharge Instructions (Signed)
VAC to lower right knee wound.  Keep in place and don't remove till office visit next week. Proximal wound - KY gel daily.

## 2017-11-10 NOTE — Progress Notes (Signed)
Pt weight 315lb on 10/15/17, pt reports he thinks he has lost some weight and believes he weighs 300lb. Pt refusing to be weight today.

## 2017-11-10 NOTE — Anesthesia Preprocedure Evaluation (Signed)
Anesthesia Evaluation  Patient identified by MRN, date of birth, ID band Patient awake    Reviewed: Allergy & Precautions, NPO status , Patient's Chart, lab work & pertinent test results  Airway Mallampati: II  TM Distance: >3 FB Neck ROM: Full    Dental  (+) Poor Dentition, Edentulous Upper   Pulmonary former smoker,    Pulmonary exam normal breath sounds clear to auscultation       Cardiovascular Exercise Tolerance: Good negative cardio ROS Normal cardiovascular exam Rhythm:Regular Rate:Normal     Neuro/Psych negative neurological ROS  negative psych ROS   GI/Hepatic Neg liver ROS,   Endo/Other  Morbid obesity  Renal/GU negative Renal ROS     Musculoskeletal negative musculoskeletal ROS (+)   Abdominal (+) + obese,   Peds  Hematology negative hematology ROS (+)   Anesthesia Other Findings Pt is Allergic to acetaminophen  Reproductive/Obstetrics                             Anesthesia Physical  Anesthesia Plan  ASA: III  Anesthesia Plan: General   Post-op Pain Management:    Induction: Intravenous  PONV Risk Score and Plan: 2 and Treatment may vary due to age or medical condition and Ondansetron  Airway Management Planned: LMA  Additional Equipment:   Intra-op Plan:   Post-operative Plan:   Informed Consent: I have reviewed the patients History and Physical, chart, labs and discussed the procedure including the risks, benefits and alternatives for the proposed anesthesia with the patient or authorized representative who has indicated his/her understanding and acceptance.   Dental advisory given  Plan Discussed with: CRNA  Anesthesia Plan Comments:         Anesthesia Quick Evaluation

## 2017-11-10 NOTE — H&P (View-Only) (Signed)
Clinton Shea. is an 84 y.o. male.   Chief Complaint: right knee wound HPI:  Here for post operative follow up following excision of a large chronic right knee seroma/hematoma ~ 15 x 15 cm with placement of Acell powder and sheet to the wound bed on 09/08/17. He reports he developed some swelling and redness above the right knee late last week. Home health called earlier this week about the swelling and redness and antibiotics were started earlier this week and he was asked to come in for further evaluation. The patient reports he has had severe nausea following taking the antibiotics and has not been able to take much of the Bactrim. He also reports he took a shower earlier today and the area above the knee opened and drainage a large amount of bloody yellow looking fluid. He is not febrile today, but reports he is not feeling well and has not been able to eat well. The small residual knee wound is stable and improving with twice daily wet to dry dressing changes. He sent the [facility] machine back to [facility] several weeks ago following the office visit 10/15/2017.  Past Medical History:  Diagnosis Date  . Arthritis   . Cancer ([place]) 2013  [place] [facility] [place]   Prostate  . Cellulitis    right leg  . Dyspnea   . Pneumonia    last time 1955  . Vertigo    11/10/17- "hasn't had an issue in months"    Past Surgical History:  Procedure Laterality Date  . COLONOSCOPY W/ POLYPECTOMY    . DEBRIDEMENT AND CLOSURE WOUND Right 09/08/2017   Procedure: RIGHT KNEE WOUND EXCISION WITH PLACEMENT OF ACELL AND [place];  Surgeon: Wallace Going, DO;  Location: [place];  Service: Plastics;  Laterality: Right;  . KNEE SURGERY Right 04/21/2017   remove bursa  . NASAL HEMORRHAGE CONTROL     as a child  . PROSTATECTOMY  2013  . TONSILLECTOMY      No family history on file. Social History:  reports that he quit smoking about 48 years ago. His smoking use included cigarettes. He quit after 20.00 years of use. He  has never used smokeless tobacco. He reports that he drank alcohol. He reports that he does not use drugs.  Allergies:  Allergies  Allergen Reactions  . Tylenol [Acetaminophen]     Severe indigestion  . Sulfa Antibiotics Nausea Only    Burning esophagus     (Not in a hospital admission)  No results found for this or any previous visit (from the past 48 hour(s)). No results found.  Review of Systems  Constitutional: Negative.   HENT: Negative.   Eyes: Negative.   Respiratory: Negative.   Cardiovascular: Negative.   Gastrointestinal: Negative.   Genitourinary: Negative.   Musculoskeletal: Negative.   Skin: Negative.   Neurological: Negative.   Psychiatric/Behavioral: Negative.     There were no vitals taken for this visit. Physical Exam  Constitutional: He is oriented to person, place, and time. He appears well-developed and well-nourished.  HENT:  Head: Normocephalic and atraumatic.  Eyes: Pupils are equal, round, and reactive to light. EOM are normal.  Cardiovascular: Normal rate.  Respiratory: Effort normal.  GI: Soft.  Musculoskeletal: Normal range of motion.  Neurological: He is alert and oriented to person, place, and time.  Skin: Skin is warm.  Psychiatric: He has a normal mood and affect. His behavior is normal. Judgment and thought content normal.  Assessment/Plan Right knee debridement with placement of Acell and VAC.  Murray, DO 11/10/2017, 8:10 AM

## 2017-11-10 NOTE — Op Note (Signed)
DATE OF OPERATION: 11/10/2017  LOCATION: [facility]  PREOPERATIVE DIAGNOSIS: chronic right knee wound x 2  POSTOPERATIVE DIAGNOSIS: Same  PROCEDURE:  1. Distal right knee excision of skin and capsule 4 x 6 cm.  2. Proximal right knee excision capsule 2 x 6 cm. 3. Placement of Acell (5 x 5 cm sheet and 1 gm powder) to proximal and distal right knee wounds 4. Culture of right proximal knee.  SURGEON: Dennice Tindol Sanger Cleston Lautner, DO  EBL: 5 cc  CONDITION: Stable  COMPLICATIONS: None  INDICATION: The patient, Clinton Shea, is a 84 y.o. male born on 11/09/1932, is here for treatment of a chronic right knee wound.   PROCEDURE DETAILS:  The patient was seen prior to surgery and marked.  The IV antibiotics were given. The patient was taken to the operating room and given a general anesthetic. A standard time out was performed and all information was confirmed by those in the room. SCD was placed on the left leg.   The #10 blade was used to excise the distal knee wound 4 x 6 cm of nonviable skin capsule.  Hemostasis was achieved with electrocautery.  The wound was irrigated with antibiotic and saline solution.  The acell powder 500 mg and 5 x 5 cm sheet was placed.  The distal and proximal portion of the wound was closed with the 3-0 Monocryl.  The sorbact was applied and secured with the 5-0 Vicryl. The VAC prevena was applied and there was an excellent seal.  Attention was turned to the proximal wound.  Cultures were obtained.  The #10 blade was used to excise the 2 x 6 cm capsule.  Hemostasis was achieved with electrocautery. The wound was irrigated with saline and antibiotic solution.  The 500 mg of Acell powder was applied.  A 5-0 Monocryl was placed to keep the skin edges close together.  The patient was allowed to wake up and taken to recovery room in stable condition at the end of the case. The family was notified at the end of the case.

## 2017-11-10 NOTE — Interval H&P Note (Signed)
History and Physical Interval Note:  11/10/2017 2:33 PM  Clinton Mail.  has presented today for surgery, with the diagnosis of open wound of right knee  The various methods of treatment have been discussed with the patient and family. After consideration of risks, benefits and other options for treatment, the patient has consented to  Procedure(s): IRRIGATION AND DEBRIDEMENT RIGHT KNEE WOUND WITH POSSIBLE ACELL PLACEMENT (Right) as a surgical intervention .  The patient's history has been reviewed, patient examined, no change in status, stable for surgery.  I have reviewed the patient's chart and labs.  Questions were answered to the patient's satisfaction.     Loel Lofty Dillingham

## 2017-11-10 NOTE — H&P (Signed)
Clinton Shea. is an 84 y.o. male.   Chief Complaint: right knee wound HPI:  Here for post operative follow up following excision of a large chronic right knee seroma/hematoma ~ 15 x 15 cm with placement of Acell powder and sheet to the wound bed on 09/08/17. He reports he developed some swelling and redness above the right knee late last week. Home health called earlier this week about the swelling and redness and antibiotics were started earlier this week and he was asked to come in for further evaluation. The patient reports he has had severe nausea following taking the antibiotics and has not been able to take much of the Bactrim. He also reports he took a shower earlier today and the area above the knee opened and drainage a large amount of bloody yellow looking fluid. He is not febrile today, but reports he is not feeling well and has not been able to eat well. The small residual knee wound is stable and improving with twice daily wet to dry dressing changes. He sent the [facility] machine back to [facility] several weeks ago following the office visit 10/15/2017.  Past Medical History:  Diagnosis Date  . Arthritis   . Cancer ([place]) 2013  [place]   Prostate  . Cellulitis    right leg  . Dyspnea   . Pneumonia    last time 1955  . Vertigo    11/10/17- "hasn't had an issue in months"    Past Surgical History:  Procedure Laterality Date  . COLONOSCOPY W/ POLYPECTOMY    . DEBRIDEMENT AND CLOSURE WOUND Right 09/08/2017   Procedure: RIGHT KNEE WOUND EXCISION WITH PLACEMENT OF ACELL AND [place];  Surgeon: Wallace Going, DO;  Location: [facility];  Service: Plastics;  Laterality: Right;  . KNEE SURGERY Right 04/21/2017   remove bursa  . NASAL HEMORRHAGE CONTROL     as a child  . PROSTATECTOMY  2013  . TONSILLECTOMY      No family history on file. Social History:  reports that he quit smoking about 48 years ago. His smoking use included cigarettes. He quit after 20.00 years of use. He  has never used smokeless tobacco. He reports that he drank alcohol. He reports that he does not use drugs.  Allergies:  Allergies  Allergen Reactions  . Tylenol [Acetaminophen]     Severe indigestion  . Sulfa Antibiotics Nausea Only    Burning esophagus     (Not in a hospital admission)  No results found for this or any previous visit (from the past 48 hour(s)). No results found.  Review of Systems  Constitutional: Negative.   HENT: Negative.   Eyes: Negative.   Respiratory: Negative.   Cardiovascular: Negative.   Gastrointestinal: Negative.   Genitourinary: Negative.   Musculoskeletal: Negative.   Skin: Negative.   Neurological: Negative.   Psychiatric/Behavioral: Negative.     There were no vitals taken for this visit. Physical Exam  Constitutional: He is oriented to person, place, and time. He appears well-developed and well-nourished.  HENT:  Head: Normocephalic and atraumatic.  Eyes: Pupils are equal, round, and reactive to light. EOM are normal.  Cardiovascular: Normal rate.  Respiratory: Effort normal.  GI: Soft.  Musculoskeletal: Normal range of motion.  Neurological: He is alert and oriented to person, place, and time.  Skin: Skin is warm.  Psychiatric: He has a normal mood and affect. His behavior is normal. Judgment and thought content normal.  Assessment/Plan Right knee debridement with placement of Acell and VAC.  [place]ock, DO 11/10/2017, 8:10 AM

## 2017-11-10 NOTE — Anesthesia Procedure Notes (Signed)
Procedure Name: LMA Insertion Date/Time: 11/10/2017 3:00 PM Performed by: Genelle Bal, CRNA Pre-anesthesia Checklist: Patient identified, Emergency Drugs available, Suction available and Patient being monitored Patient Re-evaluated:Patient Re-evaluated prior to induction Oxygen Delivery Method: Circle system utilized Preoxygenation: Pre-oxygenation with 100% oxygen Induction Type: IV induction Ventilation: Mask ventilation without difficulty LMA: LMA inserted LMA Size: 5.0 Number of attempts: 1 Placement Confirmation: positive ETCO2 Tube secured with: Tape Dental Injury: Teeth and Oropharynx as per pre-operative assessment

## 2017-11-10 NOTE — Transfer of Care (Signed)
Immediate Anesthesia Transfer of Care Note  Patient: Clinton Shea.  Procedure(s) Performed: IRRIGATION AND DEBRIDEMENT RIGHT KNEE WOUND WITH POSSIBLE ACELL PLACEMENT (Right Knee)  Patient Location: PACU  Anesthesia Type:General  Level of Consciousness: awake, alert  and oriented  Airway & Oxygen Therapy: Patient Spontanous Breathing and Patient connected to face mask oxygen  Post-op Assessment: Report given to RN and Post -op Vital signs reviewed and stable  Post vital signs: Reviewed and stable  Last Vitals:  Vitals Value Taken Time  BP 147/75 11/10/2017  4:01 PM  Temp    Pulse 80 11/10/2017  4:01 PM  Resp 15 11/10/2017  4:01 PM  SpO2 99 % 11/10/2017  4:01 PM  Vitals shown include unvalidated device data.  Last Pain:  Vitals:   11/10/17 1344  TempSrc:   PainSc: 4       Patients Stated Pain Goal: 4 (95/63/87 5643)  Complications: No apparent anesthesia complications

## 2017-11-12 ENCOUNTER — Encounter (HOSPITAL_COMMUNITY): Payer: Self-pay | Admitting: Plastic Surgery

## 2017-11-12 NOTE — Anesthesia Postprocedure Evaluation (Signed)
Anesthesia Post Note  Patient: Clinton Shea.  Procedure(s) Performed: IRRIGATION AND DEBRIDEMENT RIGHT KNEE WOUND WITH POSSIBLE ACELL PLACEMENT (Right Knee)     Patient location during evaluation: PACU Anesthesia Type: General Level of consciousness: sedated and patient cooperative Pain management: pain level controlled Vital Signs Assessment: post-procedure vital signs reviewed and stable Respiratory status: spontaneous breathing Cardiovascular status: stable Anesthetic complications: no    Last Vitals:  Vitals:   11/10/17 1615 11/10/17 1623  BP:  116/89  Pulse:  88  Resp: 16 15  Temp:    SpO2: 95% 97%    Last Pain:  Vitals:   11/10/17 1623  TempSrc:   PainSc: 0-No pain                 Nolon Nations

## 2017-11-14 DIAGNOSIS — R197 Diarrhea, unspecified: Secondary | ICD-10-CM | POA: Diagnosis not present

## 2017-11-14 DIAGNOSIS — R109 Unspecified abdominal pain: Secondary | ICD-10-CM | POA: Diagnosis not present

## 2017-11-14 DIAGNOSIS — T8149XA Infection following a procedure, other surgical site, initial encounter: Secondary | ICD-10-CM | POA: Diagnosis not present

## 2017-11-14 DIAGNOSIS — R609 Edema, unspecified: Secondary | ICD-10-CM | POA: Diagnosis not present

## 2017-11-14 DIAGNOSIS — K859 Acute pancreatitis without necrosis or infection, unspecified: Secondary | ICD-10-CM | POA: Diagnosis not present

## 2017-11-14 DIAGNOSIS — N179 Acute kidney failure, unspecified: Secondary | ICD-10-CM | POA: Diagnosis not present

## 2017-11-14 DIAGNOSIS — R531 Weakness: Secondary | ICD-10-CM | POA: Diagnosis not present

## 2017-11-15 DIAGNOSIS — R109 Unspecified abdominal pain: Secondary | ICD-10-CM | POA: Diagnosis not present

## 2017-11-15 DIAGNOSIS — Z87891 Personal history of nicotine dependence: Secondary | ICD-10-CM | POA: Diagnosis not present

## 2017-11-15 DIAGNOSIS — R319 Hematuria, unspecified: Secondary | ICD-10-CM | POA: Diagnosis present

## 2017-11-15 DIAGNOSIS — K859 Acute pancreatitis without necrosis or infection, unspecified: Secondary | ICD-10-CM | POA: Diagnosis present

## 2017-11-15 DIAGNOSIS — E860 Dehydration: Secondary | ICD-10-CM | POA: Diagnosis not present

## 2017-11-15 DIAGNOSIS — R197 Diarrhea, unspecified: Secondary | ICD-10-CM | POA: Diagnosis present

## 2017-11-15 DIAGNOSIS — T8149XA Infection following a procedure, other surgical site, initial encounter: Secondary | ICD-10-CM | POA: Diagnosis present

## 2017-11-15 DIAGNOSIS — N179 Acute kidney failure, unspecified: Secondary | ICD-10-CM | POA: Diagnosis present

## 2017-11-15 LAB — AEROBIC/ANAEROBIC CULTURE (SURGICAL/DEEP WOUND)

## 2017-11-15 LAB — AEROBIC/ANAEROBIC CULTURE W GRAM STAIN (SURGICAL/DEEP WOUND)

## 2017-11-18 ENCOUNTER — Inpatient Hospital Stay (HOSPITAL_COMMUNITY)
Admission: EM | Admit: 2017-11-18 | Discharge: 2017-11-21 | DRG: 682 | Disposition: A | Discharge status: Home / Self Care | Payer: Medicare Other | Source: Ambulatory Visit | Attending: Internal Medicine | Admitting: Internal Medicine

## 2017-11-18 ENCOUNTER — Inpatient Hospital Stay (HOSPITAL_COMMUNITY): Payer: Medicare Other

## 2017-11-18 ENCOUNTER — Emergency Department (HOSPITAL_COMMUNITY): Payer: Medicare Other

## 2017-11-18 ENCOUNTER — Encounter (HOSPITAL_COMMUNITY): Payer: Self-pay

## 2017-11-18 ENCOUNTER — Other Ambulatory Visit: Payer: Self-pay

## 2017-11-18 DIAGNOSIS — Z882 Allergy status to sulfonamides status: Secondary | ICD-10-CM

## 2017-11-18 DIAGNOSIS — R0602 Shortness of breath: Secondary | ICD-10-CM | POA: Diagnosis not present

## 2017-11-18 DIAGNOSIS — K5900 Constipation, unspecified: Secondary | ICD-10-CM | POA: Diagnosis not present

## 2017-11-18 DIAGNOSIS — K429 Umbilical hernia without obstruction or gangrene: Secondary | ICD-10-CM | POA: Diagnosis present

## 2017-11-18 DIAGNOSIS — N3289 Other specified disorders of bladder: Secondary | ICD-10-CM | POA: Diagnosis not present

## 2017-11-18 DIAGNOSIS — Z6838 Body mass index (BMI) 38.0-38.9, adult: Secondary | ICD-10-CM | POA: Diagnosis not present

## 2017-11-18 DIAGNOSIS — R600 Localized edema: Secondary | ICD-10-CM | POA: Diagnosis present

## 2017-11-18 DIAGNOSIS — R197 Diarrhea, unspecified: Secondary | ICD-10-CM | POA: Diagnosis present

## 2017-11-18 DIAGNOSIS — K859 Acute pancreatitis without necrosis or infection, unspecified: Secondary | ICD-10-CM | POA: Diagnosis not present

## 2017-11-18 DIAGNOSIS — Z79899 Other long term (current) drug therapy: Secondary | ICD-10-CM | POA: Diagnosis not present

## 2017-11-18 DIAGNOSIS — Z8546 Personal history of malignant neoplasm of prostate: Secondary | ICD-10-CM | POA: Diagnosis not present

## 2017-11-18 DIAGNOSIS — N19 Unspecified kidney failure: Secondary | ICD-10-CM | POA: Insufficient documentation

## 2017-11-18 DIAGNOSIS — D692 Other nonthrombocytopenic purpura: Secondary | ICD-10-CM | POA: Diagnosis not present

## 2017-11-18 DIAGNOSIS — R319 Hematuria, unspecified: Secondary | ICD-10-CM | POA: Diagnosis present

## 2017-11-18 DIAGNOSIS — R0682 Tachypnea, not elsewhere classified: Secondary | ICD-10-CM | POA: Diagnosis present

## 2017-11-18 DIAGNOSIS — N179 Acute kidney failure, unspecified: Secondary | ICD-10-CM | POA: Diagnosis not present

## 2017-11-18 DIAGNOSIS — R21 Rash and other nonspecific skin eruption: Secondary | ICD-10-CM | POA: Diagnosis present

## 2017-11-18 DIAGNOSIS — M7989 Other specified soft tissue disorders: Secondary | ICD-10-CM | POA: Diagnosis not present

## 2017-11-18 DIAGNOSIS — Z8614 Personal history of Methicillin resistant Staphylococcus aureus infection: Secondary | ICD-10-CM | POA: Diagnosis not present

## 2017-11-18 DIAGNOSIS — Z87891 Personal history of nicotine dependence: Secondary | ICD-10-CM | POA: Diagnosis not present

## 2017-11-18 DIAGNOSIS — Z9079 Acquired absence of other genital organ(s): Secondary | ICD-10-CM | POA: Diagnosis not present

## 2017-11-18 DIAGNOSIS — M1990 Unspecified osteoarthritis, unspecified site: Secondary | ICD-10-CM | POA: Diagnosis present

## 2017-11-18 DIAGNOSIS — A0811 Acute gastroenteropathy due to Norwalk agent: Secondary | ICD-10-CM | POA: Diagnosis not present

## 2017-11-18 DIAGNOSIS — E43 Unspecified severe protein-calorie malnutrition: Secondary | ICD-10-CM | POA: Diagnosis present

## 2017-11-18 DIAGNOSIS — R34 Anuria and oliguria: Secondary | ICD-10-CM | POA: Diagnosis present

## 2017-11-18 DIAGNOSIS — Z888 Allergy status to other drugs, medicaments and biological substances status: Secondary | ICD-10-CM | POA: Diagnosis not present

## 2017-11-18 DIAGNOSIS — Z9889 Other specified postprocedural states: Secondary | ICD-10-CM

## 2017-11-18 DIAGNOSIS — R748 Abnormal levels of other serum enzymes: Secondary | ICD-10-CM | POA: Diagnosis not present

## 2017-11-18 DIAGNOSIS — R531 Weakness: Secondary | ICD-10-CM | POA: Diagnosis not present

## 2017-11-18 DIAGNOSIS — E860 Dehydration: Secondary | ICD-10-CM | POA: Diagnosis not present

## 2017-11-18 DIAGNOSIS — E869 Volume depletion, unspecified: Secondary | ICD-10-CM | POA: Diagnosis present

## 2017-11-18 DIAGNOSIS — C61 Malignant neoplasm of prostate: Secondary | ICD-10-CM | POA: Diagnosis not present

## 2017-11-18 LAB — CBC WITH DIFFERENTIAL/PLATELET
BASOS ABS: 0 10*3/uL (ref 0.0–0.1)
BASOS PCT: 0 %
EOS ABS: 0 10*3/uL (ref 0.0–0.7)
Eosinophils Relative: 0 %
HEMATOCRIT: 36.6 % — AB (ref 39.0–52.0)
Hemoglobin: 12.2 g/dL — ABNORMAL LOW (ref 13.0–17.0)
Lymphocytes Relative: 12 %
Lymphs Abs: 1.3 10*3/uL (ref 0.7–4.0)
MCH: 31 pg (ref 26.0–34.0)
MCHC: 33.3 g/dL (ref 30.0–36.0)
MCV: 92.9 fL (ref 78.0–100.0)
MONO ABS: 0.8 10*3/uL (ref 0.1–1.0)
Monocytes Relative: 7 %
Neutro Abs: 9 10*3/uL — ABNORMAL HIGH (ref 1.7–7.7)
Neutrophils Relative %: 81 %
Platelets: 278 10*3/uL (ref 150–400)
RBC: 3.94 MIL/uL — ABNORMAL LOW (ref 4.22–5.81)
RDW: 12.9 % (ref 11.5–15.5)
WBC: 11.1 10*3/uL — ABNORMAL HIGH (ref 4.0–10.5)

## 2017-11-18 LAB — COMPREHENSIVE METABOLIC PANEL
ALBUMIN: 3 g/dL — AB (ref 3.5–5.0)
ALT: 214 U/L — ABNORMAL HIGH (ref 0–44)
AST: 116 U/L — AB (ref 15–41)
Alkaline Phosphatase: 175 U/L — ABNORMAL HIGH (ref 38–126)
Anion gap: 10 (ref 5–15)
BILIRUBIN TOTAL: 1.3 mg/dL — AB (ref 0.3–1.2)
BUN: 30 mg/dL — AB (ref 8–23)
CHLORIDE: 105 mmol/L (ref 98–111)
CO2: 21 mmol/L — ABNORMAL LOW (ref 22–32)
Calcium: 8.4 mg/dL — ABNORMAL LOW (ref 8.9–10.3)
Creatinine, Ser: 2.09 mg/dL — ABNORMAL HIGH (ref 0.61–1.24)
GFR calc Af Amer: 32 mL/min — ABNORMAL LOW (ref >60)
GFR calc non Af Amer: 28 mL/min — ABNORMAL LOW (ref >60)
GLUCOSE: 109 mg/dL — AB (ref 70–99)
POTASSIUM: 3.7 mmol/L (ref 3.5–5.1)
SODIUM: 136 mmol/L (ref 135–145)
Total Protein: 6.6 g/dL (ref 6.5–8.1)

## 2017-11-18 LAB — LIPASE, BLOOD: LIPASE: 47 U/L (ref 11–51)

## 2017-11-18 LAB — BRAIN NATRIURETIC PEPTIDE: B NATRIURETIC PEPTIDE 5: 93.1 pg/mL (ref 0.0–100.0)

## 2017-11-18 MED ORDER — ONDANSETRON HCL 4 MG PO TABS
4.0000 mg | ORAL_TABLET | Freq: Four times a day (QID) | ORAL | Status: DC | PRN
  Administered 2017-11-20: 4 mg via ORAL
  Filled 2017-11-18: qty 1

## 2017-11-18 MED ORDER — HYDROCODONE-ACETAMINOPHEN 5-325 MG PO TABS: 1.0000 | ORAL_TABLET | ORAL | Status: DC | PRN

## 2017-11-18 MED ORDER — OXYCODONE HCL 5 MG PO TABS: 5.0000 mg | ORAL_TABLET | ORAL | Status: DC | PRN

## 2017-11-18 MED ORDER — ENSURE ENLIVE PO LIQD: 237.0000 mL | Freq: Every day | ORAL | Status: DC

## 2017-11-18 MED ORDER — BOOST HIGH PROTEIN PO LIQD
1.0000 | ORAL | Status: DC
  Filled 2017-11-18: qty 237

## 2017-11-18 MED ORDER — ACETAMINOPHEN 650 MG RE SUPP: 650.0000 mg | Freq: Four times a day (QID) | RECTAL | Status: DC | PRN

## 2017-11-18 MED ORDER — SODIUM CHLORIDE 0.9 % IV SOLN
1000.0000 mL | INTRAVENOUS | Status: DC
  Administered 2017-11-18 – 2017-11-19 (×2): 1000 mL via INTRAVENOUS

## 2017-11-18 MED ORDER — ONDANSETRON HCL 4 MG/2ML IJ SOLN: 4.0000 mg | Freq: Four times a day (QID) | INTRAMUSCULAR | Status: DC | PRN

## 2017-11-18 MED ORDER — SODIUM CHLORIDE 0.9 % IV BOLUS
500.0000 mL | Freq: Once | INTRAVENOUS | Status: AC
  Administered 2017-11-18: 500 mL via INTRAVENOUS

## 2017-11-18 MED ORDER — SODIUM CHLORIDE 0.9 % IV BOLUS (SEPSIS)
1000.0000 mL | Freq: Once | INTRAVENOUS | Status: AC
  Administered 2017-11-18: 1000 mL via INTRAVENOUS

## 2017-11-18 MED ORDER — HEPARIN SODIUM (PORCINE) 5000 UNIT/ML IJ SOLN
5000.0000 [IU] | Freq: Three times a day (TID) | INTRAMUSCULAR | Status: DC
  Administered 2017-11-18 – 2017-11-21 (×8): 5000 [IU] via SUBCUTANEOUS
  Filled 2017-11-18 (×8): qty 1

## 2017-11-18 MED ORDER — ACETAMINOPHEN 325 MG PO TABS: 650.0000 mg | ORAL_TABLET | Freq: Four times a day (QID) | ORAL | Status: DC | PRN

## 2017-11-18 MED ORDER — LATANOPROST 0.005 % OP SOLN
1.0000 [drp] | Freq: Every day | OPHTHALMIC | Status: DC
  Administered 2017-11-18 – 2017-11-20 (×3): 1 [drp] via OPHTHALMIC
  Filled 2017-11-18: qty 2.5

## 2017-11-18 NOTE — H&P (Signed)
History and Physical    Clinton Shea. DXA:[id number] DOB: Oct 16, 1932 DOA: 11/18/2017  PCP: Ernestene Kiel, MD  Patient coming from: PCP's office  I have personally briefly reviewed patient's old medical records in [place]  Chief Complaint: "I have no urine output since 2 days ago"  HPI: Clinton Shea. is a 84 y.o. male with medical history significant of right knee wound treated with antibiotics and wound vac, s/o prostate cancer s/p prostatectomy, recent treatment of pancreatitis at [facility], was referred from his PCP's office today to ER due to anuria. Pt had recent complicated medical history/treatment for his right knee open wound that he was first treated with Bactrim DS on 6/28 and right after his first dose he developed severe diarrhea which currently has largely resolved but still some small amount of loose stool this morning. He then had same day surgery on 11/10/17 with irrigation and debridement of the right knee wound. He was then hospitalized at [facility] from 7/7-7/10 for acute pancreatitis and acute renal failure which per pt he was very unhappy about the course. Right around his discharge until now he has not urinated, but abdominal pain or nausea or vomiting, he went to his primary urologist's office this morning and had bladder scan checked out which showed no urine in the bladder, therefore his urologist referred him to PCP's office. From there, he was referred to ER for further evaluation. Of note, he has completed the antibiotics course before he was discharged from [facility]. In addition, he also developed petechiae/purpura type of rash during his [facility] hospitalization and he was told that pancreatitis can cause this. He denies pruritis or pain from the rash and stated if he doesn't look at his arms or legs, he won't even know they are there. He also felt his rash is not worse or better over the days. Pt has chronic bilateral LE edema. He  stated that other than no urine output, he does not really feel abdominal pain or fullness or the feeling to use restroom.  ED Course:  ER started him on Condom catheter and IVF which so far no urine output yet and he has no urgency feeling. Compared with labs of 11/10/17, his labs today showed improvement of Cr from 2.6 to 2.09, wbc improved from 14.3 to 11.1. Hb stable at 12-13. CXR showed no acute diseases.   Review of Systems: As per HPI otherwise 10 point review of systems negative.     Past Medical History:  Diagnosis Date  . Arthritis   . Cancer ([place]) 2013  [place]   Prostate  . Cellulitis    right leg  . Dyspnea   . Pneumonia    last time 1955  . Vertigo    11/10/17- "hasn't had an issue in months"    Past Surgical History:  Procedure Laterality Date  . COLONOSCOPY W/ POLYPECTOMY    . DEBRIDEMENT AND CLOSURE WOUND Right 09/08/2017   Procedure: RIGHT KNEE WOUND EXCISION WITH PLACEMENT OF ACELL AND [place];  Surgeon: Wallace Going, DO;  Location: [place];  Service: Plastics;  Laterality: Right;  . INCISION AND DRAINAGE OF WOUND Right 11/10/2017   Procedure: IRRIGATION AND DEBRIDEMENT RIGHT KNEE WOUND WITH POSSIBLE ACELL PLACEMENT;  Surgeon: Wallace Going, DO;  Location: [place];  Service: Plastics;  Laterality: Right;  . KNEE SURGERY Right 04/21/2017   remove bursa  . NASAL HEMORRHAGE CONTROL     as a  child  . PROSTATECTOMY  2013  . TONSILLECTOMY       reports that he quit smoking about 48 years ago. His smoking use included cigarettes. He quit after 20.00 years of use. He has never used smokeless tobacco. He reports that he drank alcohol. He reports that he does not use drugs.  Allergies  Allergen Reactions  . Tylenol [Acetaminophen]     Severe indigestion  . Sulfa Antibiotics Nausea Only    Burning esophagus    History reviewed. No pertinent family history.    Prior to Admission medications   Medication Sig Start Date End Date  Taking? Authorizing Provider  feeding supplement (BOOST HIGH PROTEIN) LIQD Take 1 Container by mouth daily.   Yes [provider]  furosemide (LASIX) 40 MG tablet Take 40 mg by mouth daily.    Yes [provider]  hydrocortisone cream 1 % Apply 1 application topically 2 (two) times daily.   Yes [provider]  latanoprost (XALATAN) 0.005 % ophthalmic solution Place 1 drop into both eyes at bedtime. 10/31/17  Yes [provider]    Physical Exam: Vitals:   11/18/17 1900 11/18/17 2000 11/18/17 2100  BP: (!) 141/85 (!) 109/97 (!) 149/77  Pulse: 93 98 90  Resp:   18  SpO2: 97% 97% 97%    Constitutional: NAD, calm, comfortable Vitals:   11/18/17 1900 11/18/17 2000 11/18/17 2100  BP: (!) 141/85 (!) 109/97 (!) 149/77  Pulse: 93 98 90  Resp:   18  SpO2: 97% 97% 97%   Eyes: PERRL, lids and conjunctivae normal ENMT: Mucous membranes are moist. Posterior pharynx clear of any exudate or lesions.Normal dentition.  Neck: normal, supple, no masses, no thyromegaly, No JVD Respiratory: clear to auscultation bilaterally, no wheezing, no crackles. Normal respiratory effort. No accessory muscle use.  Cardiovascular: Regular rate and rhythm, no murmurs / rubs / gallops. No extremity edema. 2+ pedal pulses. No carotid bruits.  Abdomen: no tenderness, no masses palpated. No hepatosplenomegaly. Bowel sounds positive.  Musculoskeletal: no clubbing / cyanosis. Right knee is s/p recent irrigation and debridement with dry intact skin with stitches, the bandage over it has some yellowish stains, but the wound has no purulent discharge or odor Skin: from petechiae to palpable purpura type of nonpainful rash over the thighs and arms bilaterally and nonblanchable.  Neurologic: CN 2-12 grossly intact. Sensation intact, DTR normal. Strength 5/5 in all 4.  Psychiatric: Normal judgment and insight. Alert and oriented x 3. Normal mood.      Labs on Admission: I have personally  reviewed following labs and imaging studies  CBC: Recent Labs  Lab 11/18/17 1946  WBC 11.1*  NEUTROABS 9.0*  HGB 12.2*  HCT 36.6*  MCV 92.9  PLT 161   Basic Metabolic Panel: Recent Labs  Lab 11/18/17 1946  NA 136  K 3.7  CL 105  CO2 21*  GLUCOSE 109*  BUN 30*  CREATININE 2.09*  CALCIUM 8.4*   GFR: Estimated Creatinine Clearance: 40.9 mL/min (A) (by C-G formula based on SCr of 2.09 mg/dL (H)). Liver Function Tests: Recent Labs  Lab 11/18/17 1946  AST 116*  ALT 214*  ALKPHOS 175*  BILITOT 1.3*  PROT 6.6  ALBUMIN 3.0*   Recent Labs  Lab 11/18/17 1946  LIPASE 47   No results for input(s): AMMONIA in the last 168 hours. Coagulation Profile: No results for input(s): INR, PROTIME in the last 168 hours. Cardiac Enzymes: No results for input(s): CKTOTAL, CKMB, CKMBINDEX, TROPONINI in  the last 168 hours. BNP (last 3 results) No results for input(s): PROBNP in the last 8760 hours. HbA1C: No results for input(s): HGBA1C in the last 72 hours. CBG: No results for input(s): GLUCAP in the last 168 hours. Lipid Profile: No results for input(s): CHOL, HDL, LDLCALC, TRIG, CHOLHDL, LDLDIRECT in the last 72 hours. Thyroid Function Tests: No results for input(s): TSH, T4TOTAL, FREET4, T3FREE, THYROIDAB in the last 72 hours. Anemia Panel: No results for input(s): VITAMINB12, FOLATE, FERRITIN, TIBC, IRON, RETICCTPCT in the last 72 hours. Urine analysis: No results found for: COLORURINE, APPEARANCEUR, LABSPEC, [place], GLUCOSEU, HGBUR, BILIRUBINUR, KETONESUR, PROTEINUR, UROBILINOGEN, NITRITE, LEUKOCYTESUR  Radiological Exams on Admission: Dg Chest 1 View  Result Date: 11/18/2017 CLINICAL DATA:  Shortness of breath. Inability to urinate. Recent knee surgery. EXAM: CHEST  1 VIEW COMPARISON:  04/13/2015 FINDINGS: Two rotated AP portable radiographs of the chest. Midline trachea. Apparent right paratracheal soft tissue fullness is felt to be similar, given differences in  obliquity. Tortuous thoracic aorta. Mild cardiomegaly. No pleural effusion or pneumothorax. Hyperinflation. No lobar consolidation. No congestive failure. IMPRESSION: Mild hyperinflation and cardiomegaly, without acute disease. Two rotated frontal radiographs. Apparent right paratracheal soft tissue fullness is likely due to obliquity. When patient is clinically stable, consider PA and lateral radiographs with attention to this area. Electronically Signed   By: Abigail Miyamoto M.D.   On: 11/18/2017 20:03    EKG: Independently reviewed. NSR  Assessment/Plan Principal Problem:   Anuria Active Problems:   AKI (acute kidney injury) ([place])   H/O prostate cancer   S/P prostatectomy   Diarrhea   Rash   Volume depletion    Plan: -admit to med-surg floor as IP status -continue IV hydration until pt starts to make urine, measure I's/O's -renal US to rule out obstruction although low suspicion -diarrhea is largely resolved however will check C diff PCR and biofire to rule out infectious etiology, although this is also less likely -not sure rash is due to pancreatitis vs vasculitis vs drug induced -if no improvement of urine output and renal function despite aggressive if hydration, please call nephrology consult.  -renal diet  DVT prophylaxis: heparin  Code Status: full code  Family Communication: no family at bedside  Disposition Plan: home with healthcare service in 2-3 days Consults called: potentially can call nephrology consult Admission status:medical floor  Paticia Stack MD [facility] Pager [phone]  If 7PM-7AM, please contact night-coverage [email or web address] Password [facility]  11/18/2017, 9:52 PM

## 2017-11-18 NOTE — ED Notes (Signed)
Second attempt to call report made, [place], RN states she will call back when ready for report.

## 2017-11-18 NOTE — ED Notes (Addendum)
First attempt to call report made, no answer.

## 2017-11-18 NOTE — ED Notes (Signed)
ED TO INPATIENT HANDOFF REPORT  Name/Age/Gender Clinton Shea. 84 y.o. male  Code Status    Code Status Orders  (From admission, onward)        Start     Ordered   11/18/17 2113  Full code  Continuous     11/18/17 2116    Code Status History    This patient has a current code status but no historical code status.    Advance Directive Documentation     Most Recent Value  Type of Advance Directive  Living will, Healthcare Power of Attorney  Pre-existing out of facility DNR order (yellow form or pink MOST form)  -  "MOST" Form in Place?  -      Home/SNF/Other Home  Chief Complaint urinary retention  Level of Care/Admitting Diagnosis ED Disposition    ED Disposition Condition [place]: [facility] [[id number]]  Level of Care: Med-Surg [16]  Diagnosis: Anuria [182993]  Admitting Physician: Paticia Stack [[id number]]  Attending Physician: Paticia Stack [[id number]]  Estimated length of stay: 3 - 4 days  Certification:: I certify this patient will need inpatient services for at least 2 midnights  PT Class (Do Not Modify): Inpatient [101]  PT Acc Code (Do Not Modify): Private [1]       Medical History Past Medical History:  Diagnosis Date  . Arthritis   . Cancer ([place]) 2013  [place]   Prostate  . Cellulitis    right leg  . Dyspnea   . Pneumonia    last time 1955  . Vertigo    11/10/17- "hasn't had an issue in months"    Allergies Allergies  Allergen Reactions  . Tylenol [Acetaminophen]     Severe indigestion  . Sulfa Antibiotics Nausea Only    Burning esophagus    IV Location/Drains/Wounds Patient Lines/Drains/Airways Status   Active Line/Drains/Airways    Name:   Placement date:   Placement time:   Site:   Days:   Peripheral IV 11/18/17 Left;Distal;Posterior Forearm   11/18/17    1944    Forearm   less than 1   Negative Pressure Wound Therapy Knee Right   09/08/17    1255    -   71   Negative Pressure Wound  Therapy Knee Right   11/10/17    1551    -   8   Incision (Closed) 09/08/17 Knee Right   09/08/17    1303     71   Incision (Closed) 09/08/17 Ankle Right   09/08/17    1303     71   Incision (Closed) 11/10/17 Knee Right   11/10/17    1516     8          Labs/Imaging Results for orders placed or performed during the hospital encounter of 11/18/17 (from the past 48 hour(s))  Comprehensive metabolic panel     Status: Abnormal   Collection Time: 11/18/17  7:46 PM  Result Value Ref Range   Sodium 136 135 - 145 mmol/L   Potassium 3.7 3.5 - 5.1 mmol/L   Chloride 105 98 - 111 mmol/L    Comment: Please note change in reference range.   CO2 21 (L) 22 - 32 mmol/L   Glucose, Bld 109 (H) 70 - 99 mg/dL    Comment: Please note change in reference range.   BUN 30 (H) 8 - 23 mg/dL    Comment: Please  note change in reference range.   Creatinine, Ser 2.09 (H) 0.61 - 1.24 mg/dL   Calcium 8.4 (L) 8.9 - 10.3 mg/dL   Total Protein 6.6 6.5 - 8.1 g/dL   Albumin 3.0 (L) 3.5 - 5.0 g/dL   AST 116 (H) 15 - 41 U/L   ALT 214 (H) 0 - 44 U/L    Comment: Please note change in reference range.   Alkaline Phosphatase 175 (H) 38 - 126 U/L   Total Bilirubin 1.3 (H) 0.3 - 1.2 mg/dL   GFR calc non Af Amer 28 (L) >60 mL/min   GFR calc Af Amer 32 (L) >60 mL/min    Comment: (NOTE) The eGFR has been calculated using the CKD EPI equation. This calculation has not been validated in all clinical situations. eGFR's persistently <60 mL/min signify possible Chronic Kidney Disease.    Anion gap 10 5 - 15    Comment: Performed at [facility], [place] [address] [place], [place] [address]  CBC with Differential     Status: Abnormal   Collection Time: 11/18/17  7:46 PM  Result Value Ref Range   WBC 11.1 (H) 4.0 - 10.5 K/uL   RBC 3.94 (L) 4.22 - 5.81 MIL/uL   Hemoglobin 12.2 (L) 13.0 - 17.0 g/dL   HCT 36.6 (L) 39.0 - 52.0 %   MCV 92.9 78.0 - 100.0 fL   MCH 31.0 26.0 - 34.0 pg   MCHC 33.3 30.0 - 36.0  g/dL   RDW 12.9 11.5 - 15.5 %   Platelets 278 150 - 400 K/uL   Neutrophils Relative % 81 %   Neutro Abs 9.0 (H) 1.7 - 7.7 K/uL   Lymphocytes Relative 12 %   Lymphs Abs 1.3 0.7 - 4.0 K/uL   Monocytes Relative 7 %   Monocytes Absolute 0.8 0.1 - 1.0 K/uL   Eosinophils Relative 0 %   Eosinophils Absolute 0.0 0.0 - 0.7 K/uL   Basophils Relative 0 %   Basophils Absolute 0.0 0.0 - 0.1 K/uL    Comment: Performed at [facility], [place] [address] [place], [place] [address]  Lipase, blood     Status: None   Collection Time: 11/18/17  7:46 PM  Result Value Ref Range   Lipase 47 11 - 51 U/L    Comment: Performed at [facility], [place] [address] [place], [place] [address]  Brain natriuretic peptide     Status: None   Collection Time: 11/18/17  7:46 PM  Result Value Ref Range   B Natriuretic Peptide 93.1 0.0 - 100.0 pg/mL    Comment: Performed at [facility], [place] [address], [place],  [address]   Dg Chest 1 View  Result Date: 11/18/2017 CLINICAL DATA:  Shortness of breath. Inability to urinate. Recent knee surgery. EXAM: CHEST  1 VIEW COMPARISON:  04/13/2015 FINDINGS: Two rotated AP portable radiographs of the chest. Midline trachea. Apparent right paratracheal soft tissue fullness is felt to be similar, given differences in obliquity. Tortuous thoracic aorta. Mild cardiomegaly. No pleural effusion or pneumothorax. Hyperinflation. No lobar consolidation. No congestive failure. IMPRESSION: Mild hyperinflation and cardiomegaly, without acute disease. Two rotated frontal radiographs. Apparent right paratracheal soft tissue fullness is likely due to obliquity. When patient is clinically stable, consider PA and lateral radiographs with attention to this area. Electronically Signed   By: Abigail Miyamoto M.D.   On: 11/18/2017 20:03   US Renal  Result Date: 11/18/2017 CLINICAL DATA:  Renal failure.  History of prostate cancer. EXAM: RENAL /  URINARY TRACT ULTRASOUND COMPLETE COMPARISON:  CT abdomen and pelvis November 14, 2017 FINDINGS: Right Kidney: Length: 11.4 cm. Echogenicity within normal limits. No mass or hydronephrosis visualized. 12.8 cm anechoic cyst RIGHT perinephric space, possibly arising from the RIGHT kidney. Left Kidney: Length: 12.9 cm. Echogenicity within normal limits. No mass or hydronephrosis visualized. Anechoic cyst LEFT kidney measuring to 5.6 cm. Bladder: Appears normal for degree of bladder distention. However, ureteral jets not demonstrated. IMPRESSION: No obstructive uropathy.  Bilateral renal and/or perirenal cysts. Ureteral jets not demonstrated. Electronically Signed   By: Elon Alas M.D.   On: 11/18/2017 22:43    Pending Labs Unresulted Labs (From admission, onward)   Start     Ordered   11/19/17 1540  Basic metabolic panel  Tomorrow morning,   R     11/18/17 2116   11/19/17 0500  CBC  Tomorrow morning,   R     11/18/17 2116   11/18/17 2115  Urine culture  Once,   R     11/18/17 2116   11/18/17 2115  Urinalysis, Routine w reflex microscopic  Once,   R     11/18/17 2116   11/18/17 2112  CBC  (heparin)  Once,   R    Comments:  Baseline for heparin therapy IF NOT ALREADY DRAWN.  Notify MD if PLT < 100 K.    11/18/17 2116   11/18/17 2112  Creatinine, serum  (heparin)  Once,   R    Comments:  Baseline for heparin therapy IF NOT ALREADY DRAWN.    11/18/17 2116   11/18/17 2111  Gastrointestinal Panel by PCR , Stool  (Gastrointestinal Panel by PCR, Stool)  Once,   R     11/18/17 2110   11/18/17 2030  C difficile quick scan w PCR reflex  (C Difficile quick screen w PCR reflex panel)  Once, for 24 hours,   R     11/18/17 2029   11/18/17 1901  Urinalysis, Routine w reflex microscopic  STAT,   STAT     11/18/17 1901      Vitals/Pain Today's Vitals   11/18/17 1900 11/18/17 2000 11/18/17 2100  BP: (!) 141/85 (!) 109/97 (!) 149/77  Pulse: 93 98 90  Resp:   18  SpO2: 97% 97% 97%    Isolation  Precautions Enteric precautions (UV disinfection)  Medications Medications  sodium chloride 0.9 % bolus 1,000 mL (1,000 mLs Intravenous New Bag/Given 11/18/17 2129)    Followed by  0.9 %  sodium chloride infusion (has no administration in time range)  feeding supplement (BOOST HIGH PROTEIN) liquid 237 mL (has no administration in time range)  latanoprost (XALATAN) 0.005 % ophthalmic solution 1 drop (has no administration in time range)  heparin injection 5,000 Units (has no administration in time range)  ondansetron (ZOFRAN) tablet 4 mg (has no administration in time range)    Or  ondansetron (ZOFRAN) injection 4 mg (has no administration in time range)  oxyCODONE (Oxy IR/ROXICODONE) immediate release tablet 5 mg (has no administration in time range)  sodium chloride 0.9 % bolus 500 mL (0 mLs Intravenous Stopped 11/18/17 2101)    Mobility walks with device

## 2017-11-18 NOTE — ED Triage Notes (Signed)
Pt arrived via  [facility] from PCP  office. Per EMS pt reports that he has not been able to urinate x 2 days. Pt reports unable to eat but has been able to drink and reports that he has has had a rash the past few days, and red papuels are noted all over pt body . Pt is alert and orinted x 4 and is verbally responsive. Pt is able to ambulate with use of a walker.   S/p  Knee surgery 11/10/17 EMS v/s BP 140/80, HR 104, RR 18 O2 98% RA .

## 2017-11-18 NOTE — ED Notes (Signed)
Bladder scanned showed < 67ml .

## 2017-11-18 NOTE — ED Provider Notes (Signed)
[facility] DEPT Provider Note   CSN: [id number] Arrival date & time: 11/18/17  1804     History   Chief Complaint Chief Complaint  Patient presents with  . Urinary Retention    HPI Clinton Shea. is a 84 y.o. male.  84 year old male brought in by EMS from PCP office.  Patient was PCP office today for follow-up from hospitalization at [facility] July 7 through the 10th for pancreatitis and acute renal failure.  Patient states that he has had 2 bottles of green tea today and a little bit of water and has not had any urinary output for the past 2 days.  His abdominal pain or feeling like he needs to urinate.  Patient states on June 28 he took an antibiotic for an infection in his knee and had diarrhea.  Patient was followed by his plastic surgeon who removed what sounds like an abscess and revision of right knee surgical wound which was not healing properly and placed a wound VAC on from July 3-10.  Patient states since taking the antibiotics he has had constipation and diarrhea, no longer on antibiotics.  Patient has a rash to his arms and legs which is new.  Patient notes swelling to his lower extremities, right more so than left which is normal and baseline for him.  Patient denies chest pain or shortness of breath, fevers, chills.  No other complaints or concerns.     Past Medical History:  Diagnosis Date  . Arthritis   . Cancer ([place]) 2013  [place]   Prostate  . Cellulitis    right leg  . Dyspnea   . Pneumonia    last time 1955  . Vertigo    11/10/17- "hasn't had an issue in months"    There are no active problems to display for this patient.   Past Surgical History:  Procedure Laterality Date  . COLONOSCOPY W/ POLYPECTOMY    . DEBRIDEMENT AND CLOSURE WOUND Right 09/08/2017   Procedure: RIGHT KNEE WOUND EXCISION WITH PLACEMENT OF ACELL AND [place];  Surgeon: Wallace Going, DO;  Location: [facility];  Service:  Plastics;  Laterality: Right;  . INCISION AND DRAINAGE OF WOUND Right 11/10/2017   Procedure: IRRIGATION AND DEBRIDEMENT RIGHT KNEE WOUND WITH POSSIBLE ACELL PLACEMENT;  Surgeon: Wallace Going, DO;  Location: [facility];  Service: Plastics;  Laterality: Right;  . KNEE SURGERY Right 04/21/2017   remove bursa  . NASAL HEMORRHAGE CONTROL     as a child  . PROSTATECTOMY  2013  . TONSILLECTOMY          Home Medications    Prior to Admission medications   Medication Sig Start Date End Date Taking? Authorizing Provider  feeding supplement (BOOST HIGH PROTEIN) LIQD Take 1 Container by mouth daily.   Yes [provider]  furosemide (LASIX) 40 MG tablet Take 40 mg by mouth daily.    Yes [provider]  hydrocortisone cream 1 % Apply 1 application topically 2 (two) times daily.   Yes [provider]  latanoprost (XALATAN) 0.005 % ophthalmic solution Place 1 drop into both eyes at bedtime. 10/31/17  Yes [provider]    Family History History reviewed. No pertinent family history.  Social History Social History   Tobacco Use  . Smoking status: Former Smoker    Years: 20.00    Types: Cigarettes    Last attempt to quit: 08/23/1969    Years  since quitting: 48.2  . Smokeless tobacco: Never Used  Substance Use Topics  . Alcohol use: Not Currently  . Drug use: Never     Allergies   Tylenol [acetaminophen] and Sulfa antibiotics   Review of Systems Review of Systems  Constitutional: Negative for chills and fever.  Respiratory: Negative for cough, chest tightness, shortness of breath and wheezing.   Cardiovascular: Negative for chest pain.  Gastrointestinal: Positive for constipation and diarrhea. Negative for abdominal distention, abdominal pain, nausea and vomiting.  Genitourinary: Positive for decreased urine volume.  Musculoskeletal: Negative for arthralgias and myalgias.  Skin: Positive for rash and wound.  Allergic/Immunologic: Negative  for immunocompromised state.  Neurological: Negative for dizziness and numbness.  Hematological: Bruises/bleeds easily.  Psychiatric/Behavioral: Negative for confusion.  All other systems reviewed and are negative.    Physical Exam Updated Vital Signs There were no vitals taken for this visit.  Physical Exam  Constitutional: He is oriented to person, place, and time. He appears well-developed and well-nourished.  HENT:  Head: Normocephalic and atraumatic.  Eyes: Conjunctivae are normal.  Cardiovascular: Normal rate, regular rhythm and intact distal pulses.  No murmur heard. Pulmonary/Chest: Breath sounds normal. Tachypnea noted. He has no wheezes.  Tachypneic, speaks in full sentences without difficulty, states that this is his baseline respiratory status due to his body habitus.  Abdominal: Soft. Bowel sounds are normal. He exhibits no distension. There is no tenderness. A hernia is present.  Umbilical hernia, soft, easily reduced, non tender  Musculoskeletal: He exhibits no tenderness or deformity.  Neurological: He is alert and oriented to person, place, and time.  Skin: Skin is warm and dry. Rash noted.  Purpura noted to extremities. Right knee surgical site without signs of infection.   Psychiatric: He has a normal mood and affect. His behavior is normal.  Nursing note and vitals reviewed.    ED Treatments / Results  Labs (all labs ordered are listed, but only abnormal results are displayed) Labs Reviewed  COMPREHENSIVE METABOLIC PANEL - Abnormal; Notable for the following components:      Result Value   CO2 21 (*)    Glucose, Bld 109 (*)    BUN 30 (*)    Creatinine, Ser 2.09 (*)    Calcium 8.4 (*)    Albumin 3.0 (*)    AST 116 (*)    ALT 214 (*)    Alkaline Phosphatase 175 (*)    Total Bilirubin 1.3 (*)    GFR calc non Af Amer 28 (*)    GFR calc Af Amer 32 (*)    All other components within normal limits  CBC WITH DIFFERENTIAL/PLATELET - Abnormal; Notable for  the following components:   WBC 11.1 (*)    RBC 3.94 (*)    Hemoglobin 12.2 (*)    HCT 36.6 (*)    Neutro Abs 9.0 (*)    All other components within normal limits  C DIFFICILE QUICK SCREEN W PCR REFLEX  LIPASE, BLOOD  BRAIN NATRIURETIC PEPTIDE  URINALYSIS, ROUTINE W REFLEX MICROSCOPIC    EKG None  Radiology Dg Chest 1 View  Result Date: 11/18/2017 CLINICAL DATA:  Shortness of breath. Inability to urinate. Recent knee surgery. EXAM: CHEST  1 VIEW COMPARISON:  04/13/2015 FINDINGS: Two rotated AP portable radiographs of the chest. Midline trachea. Apparent right paratracheal soft tissue fullness is felt to be similar, given differences in obliquity. Tortuous thoracic aorta. Mild cardiomegaly. No pleural effusion or pneumothorax. Hyperinflation. No lobar consolidation. No congestive failure. IMPRESSION:  Mild hyperinflation and cardiomegaly, without acute disease. Two rotated frontal radiographs. Apparent right paratracheal soft tissue fullness is likely due to obliquity. When patient is clinically stable, consider PA and lateral radiographs with attention to this area. Electronically Signed   By: Abigail Miyamoto M.D.   On: 11/18/2017 20:03    Procedures Procedures (including critical care time)  Medications Ordered in ED Medications  sodium chloride 0.9 % bolus 500 mL (500 mLs Intravenous New Bag/Given 11/18/17 2000)  sodium chloride 0.9 % bolus 1,000 mL (has no administration in time range)    Followed by  0.9 %  sodium chloride infusion (has no administration in time range)     Initial Impression / Assessment and Plan / ED Course  I have reviewed the triage vital signs and the nursing notes.  Pertinent labs & imaging results that were available during my care of the patient were reviewed by me and considered in my medical decision making (see chart for details).  Clinical Course as of Nov 19 2047  Thu Nov 18, 1152  [id number] 84 year old male with history of recent hospitalization for  acute pancreatitis and acute renal failure, admitted to [facility] July 7-10 presents to the emergency room, sent by EMS from PCP office for no urinary output x2 days and a rash on his extremities. Leading up to today's visit, patient was given antibiotics for an infection in his right knee, had surgery on July 3 to remove what sounds like an abscess just above his patella as well as revise a wound across his anterior knee, wound VAC placed x7 days.  The wound appears to be healing without evidence of infection.  Patient states that he developed severe diarrhea from his antibiotics.  Review of lab work shows patient's creatinine 2 months ago was 1.0, preop labs showed creatinine just over 2.  Question of patient developed an acute kidney injury from dehydration from his diarrhea.  On exam noted to have purpura to his extremities, question HSP secondary to acute kidney injury.  Review of lab work shows BNP is normal, lipase normal, urinalysis pending (patient is not making urine at this time), CBC unremarkable, CMP with creatinine of 2.09, BUN of 30, GFR 28.  Patient's liver enzymes and total bili have improved compared to previous labs per paperwork that he has at bedside.  Case discussed with Dr. Venora Maples, ER attending, recommends hospitalist admit for acute kidney injury and further management.  Case discussed with Dr. Ivin Booty, hospitalist, will see the patient.   [LM]    Clinical Course User Index [LM] Tacy Learn, PA-C    Final Clinical Impressions(s) / ED Diagnoses   Final diagnoses:  Acute kidney injury [facility])  Purpura [facility])    ED Discharge Orders    None       Roque Lias 11/18/17 2049    Jola Schmidt, MD 11/18/17 2348

## 2017-11-18 NOTE — ED Notes (Signed)
Pt has Bilateral LE edema and surgical incision to rt knee some yellow drainage.

## 2017-11-18 NOTE — ED Notes (Signed)
Bed: WA07 Expected date:  Expected time:  Means of arrival:  Comments: 84 yo m unable to urinate

## 2017-11-19 ENCOUNTER — Inpatient Hospital Stay (HOSPITAL_COMMUNITY): Payer: Medicare Other

## 2017-11-19 DIAGNOSIS — M7989 Other specified soft tissue disorders: Secondary | ICD-10-CM

## 2017-11-19 DIAGNOSIS — R21 Rash and other nonspecific skin eruption: Secondary | ICD-10-CM

## 2017-11-19 DIAGNOSIS — N179 Acute kidney failure, unspecified: Principal | ICD-10-CM

## 2017-11-19 DIAGNOSIS — Z8546 Personal history of malignant neoplasm of prostate: Secondary | ICD-10-CM

## 2017-11-19 DIAGNOSIS — R34 Anuria and oliguria: Secondary | ICD-10-CM

## 2017-11-19 DIAGNOSIS — R197 Diarrhea, unspecified: Secondary | ICD-10-CM

## 2017-11-19 LAB — URINALYSIS, ROUTINE W REFLEX MICROSCOPIC
Bilirubin Urine: NEGATIVE
Glucose, UA: NEGATIVE mg/dL
Ketones, ur: NEGATIVE mg/dL
NITRITE: NEGATIVE
PH: 6 (ref 5.0–8.0)
Protein, ur: 100 mg/dL — AB
RBC / HPF: >50 RBC/hpf — ABNORMAL HIGH (ref 0–5)
SPECIFIC GRAVITY, URINE: 1.011 (ref 1.005–1.030)

## 2017-11-19 LAB — COMPREHENSIVE METABOLIC PANEL
ALBUMIN: 2.6 g/dL — AB (ref 3.5–5.0)
ALT: 137 U/L — ABNORMAL HIGH (ref 0–44)
ANION GAP: 7 (ref 5–15)
AST: 70 U/L — AB (ref 15–41)
Alkaline Phosphatase: 136 U/L — ABNORMAL HIGH (ref 38–126)
BUN: 31 mg/dL — ABNORMAL HIGH (ref 8–23)
CHLORIDE: 109 mmol/L (ref 98–111)
CO2: 24 mmol/L (ref 22–32)
Calcium: 8 mg/dL — ABNORMAL LOW (ref 8.9–10.3)
Creatinine, Ser: 2.04 mg/dL — ABNORMAL HIGH (ref 0.61–1.24)
GFR calc Af Amer: 33 mL/min — ABNORMAL LOW (ref >60)
GFR calc non Af Amer: 28 mL/min — ABNORMAL LOW (ref >60)
Glucose, Bld: 127 mg/dL — ABNORMAL HIGH (ref 70–99)
POTASSIUM: 4.2 mmol/L (ref 3.5–5.1)
SODIUM: 140 mmol/L (ref 135–145)
Total Bilirubin: 0.9 mg/dL (ref 0.3–1.2)
Total Protein: 5.7 g/dL — ABNORMAL LOW (ref 6.5–8.1)

## 2017-11-19 LAB — PSA: Prostatic Specific Antigen: 0.15 ng/mL (ref 0.00–4.00)

## 2017-11-19 LAB — BASIC METABOLIC PANEL
ANION GAP: 7 (ref 5–15)
BUN: 29 mg/dL — ABNORMAL HIGH (ref 8–23)
CHLORIDE: 107 mmol/L (ref 98–111)
CO2: 23 mmol/L (ref 22–32)
Calcium: 7.9 mg/dL — ABNORMAL LOW (ref 8.9–10.3)
Creatinine, Ser: 2.07 mg/dL — ABNORMAL HIGH (ref 0.61–1.24)
GFR calc non Af Amer: 28 mL/min — ABNORMAL LOW (ref >60)
GFR, EST AFRICAN AMERICAN: 32 mL/min — AB (ref >60)
Glucose, Bld: 105 mg/dL — ABNORMAL HIGH (ref 70–99)
Potassium: 3.8 mmol/L (ref 3.5–5.1)
Sodium: 137 mmol/L (ref 135–145)

## 2017-11-19 LAB — CBC
HCT: 30.7 % — ABNORMAL LOW (ref 39.0–52.0)
HEMOGLOBIN: 10.2 g/dL — AB (ref 13.0–17.0)
MCH: 30.8 pg (ref 26.0–34.0)
MCHC: 33.2 g/dL (ref 30.0–36.0)
MCV: 92.7 fL (ref 78.0–100.0)
Platelets: 239 10*3/uL (ref 150–400)
RBC: 3.31 MIL/uL — AB (ref 4.22–5.81)
RDW: 13.2 % (ref 11.5–15.5)
WBC: 8.4 10*3/uL (ref 4.0–10.5)

## 2017-11-19 LAB — MRSA PCR SCREENING: MRSA by PCR: NEGATIVE

## 2017-11-19 LAB — C DIFFICILE QUICK SCREEN W PCR REFLEX
C DIFFICILE (CDIFF) INTERP: NOT DETECTED
C DIFFICLE (CDIFF) ANTIGEN: NEGATIVE
C Diff toxin: NEGATIVE

## 2017-11-19 MED ORDER — PREMIER PROTEIN SHAKE
11.0000 [oz_av] | ORAL | Status: DC
  Administered 2017-11-19 – 2017-11-20 (×2): 11 [oz_av] via ORAL
  Filled 2017-11-19 (×3): qty 325.31

## 2017-11-19 NOTE — Progress Notes (Signed)
Initial Nutrition Assessment  DOCUMENTATION CODES:   Obesity unspecified  INTERVENTION:   Provide Premier Protein once/day, each supplement provides 160kcal and 30g protein.   NUTRITION DIAGNOSIS:   Inadequate oral intake related to vomiting, diarrhea, nausea as evidenced by per patient/family report.  GOAL:   Patient will meet greater than or equal to 90% of their needs  MONITOR:   PO intake, Supplement acceptance, Labs, Weight trends, I & O's  REASON FOR ASSESSMENT:   Malnutrition Screening Tool    ASSESSMENT:   Patient with PMH significant for prostate cancer s/p prostatectomy, right knee wound treated with wound vac, and cellulitis. Recently had I&D of his right knee wound on 7/3 and admitted to [facility] 7/7-7/10 for acute pancreatitis and AKI. Presents this admission at the request of Urology for Anuria and AKI.    Pt reports having decreased intake since 6/28 due to ongoing n/v/d. States during this time period he was hungry but could not hold food down. He averaged one meal/day and could tolerate Atkins protein shakes. N/V/D has subsided this admission. RD observed breakfast tray 100% completed (eggs, toast, oranges). Pt reports intake is progressing as his symptoms lessen. Will provide pt with supplementation.   Pt endorses a UBW of 234 lb and a recent wt loss of 15 lb in one month. Records indicate pt weighed 315 lb 09/08/17 and 307 lb this admission (2.5% wt loss in one month, insignificant for time frame). Nutrition-Focused physical exam completed.   Medications reviewed.  Labs reviewed: AST 116 (H) ALT 214 (H)   NUTRITION - FOCUSED PHYSICAL EXAM:    Most Recent Value  Orbital Region  No depletion  Upper Arm Region  No depletion  Thoracic and Lumbar Region  Unable to assess  Buccal Region  No depletion  Temple Region  No depletion  Clavicle Bone Region  No depletion  Clavicle and Acromion Bone Region  No depletion  Scapular Bone Region  Unable to  assess  Dorsal Hand  No depletion  Patellar Region  No depletion  Anterior Thigh Region  No depletion  Posterior Calf Region  No depletion  Edema (RD Assessment)  Mild  Hair  Reviewed  Eyes  Reviewed  Mouth  Reviewed  Skin  Reviewed  Nails  Reviewed     Diet Order:   Diet Order           Diet renal with fluid restriction Fluid restriction: 1200 mL Fluid; Room service appropriate? Yes; Fluid consistency: Thin  Diet effective now          EDUCATION NEEDS:   Education needs have been addressed  Skin:  Skin Assessment: Skin Integrity Issues: Skin Integrity Issues:: Incisions Incisions: closed right knee  Last BM:  11/18/17  Height:   Ht Readings from Last 1 Encounters:  11/18/17 6\' 3"  (1.905 m)    Weight:   Wt Readings from Last 1 Encounters:  11/18/17 (!) 309 lb 3.2 oz (140.3 kg)    Ideal Body Weight:  89.1 kg  BMI:  Body mass index is 38.65 kg/m.  Estimated Nutritional Needs:   Kcal:  2200-2400 kcal  Protein:  110-120 grams   Fluid:  1200 ml fluid restriction    Mariana Single RD, LDN Clinical Nutrition Pager # - [phone]

## 2017-11-19 NOTE — Progress Notes (Signed)
PROGRESS NOTE    [name].   UUV:[id number]  DOB: 04/19/1933  DOA: 11/18/2017 PCP: Ernestene Kiel, MD   Brief Narrative:  Rafiel Mecca. is an 84 y/o male with h/o prostate CA s/p radical prostatectomy and issues with a right knee wound over the past several months.  He initially developed a seroma after falling last summer and this was aspirated by Dr Lorin Mercy. 300 cc were removed but it re accumulated a few weeks later and was too thick to aspirate. He then underwent a bursectomy on 12/18 and wound Vac placement but the seroma recurred int he pocket. It was aspirated by Dr Marla Roe on 08/10/17 who recommended excision of the right knee wound and capsule with Vac placement. On 5/1 he underwent excision of the seroma/hematoma with placement of Acell and a wound Vac. He has since had close post op follow up and wound care.  8 wks post op (late June), he was called in Bactrim for the wound which caused severe diarrhea followed by vomiting the next day.  His wound also began to drain and repeat I and D was done on 11/10/17. He was then switched to Doxycycline but continued to do poorly with development of pain when swallowing.   He presented to [facility] on 7/7 and was found to have severe acute pancreatitis (Lipase 17,788, T bili 2.6 and AST 505 and ALT 329) and AKI (Cr 2.10) for which he was treated and then discharged on 7/10. Cr was 1.07 and Lipase was 313 on d/c.   He was seen in follow up at the PCP   During the duration prior to the hospital day he developed a diffuse rash and has yet to find out what caused it. The rash has not worsened and he is unable to tell if it is improving. It does not itch.  He presents to the ED for inability to urinate for 2 days and swelling that is worse in his right leg. He states his leg has swollen up to 3 times the size of his left leg.    Cr was 2.09, AST 116, ALT 214, T bili 1.3, WBC 11.1, UA showed > 50 RBC, rare bacteria and trace  leukocytes. Renal ultrasound bilateral renal or perirenal cysts but no other abnormalities.   MRSA noted in wound from I and of Knee on 7/3. MRSA PCR is negative.    Subjective: Numerous complaints including diarrhea, inability to urinate until he received fluids in ED, rash that is not going away. He states he needs a PSA as he went to his PCP's office to have this done when it was decided for him to go to the ED.  Later family in room is asking the same questions.     Assessment & Plan:   Principal Problem:   Anuria   AKI (acute kidney injury) - cont IVF and follow urine output and renal function- cont to hold Lasix  Diarrhea and poor appetitie - apparently started with Bactrim and has not resolved -  He will need a CT abd/pelvis with contrast when renal function improves -  c diff was negative at [place]    H/O prostate cancer   S/P prostatectomy - he received a dose of Lupron on 07/01/17 with a plan for f/u PSA in 6 months - he is demanding we check a PSA now- will order this    Rash - macular rash which is not getting worse- he is not the one  who noticed the rash (it was noticed when he was admitted in [place]) and he cannot tell me if it is getting better - there is no itching   Severe malnutrition - start Premeir protein   DVT prophylaxis: Heparin Code Status: Full code Family Communication: wife, daughter and granddaughter  Disposition Plan: f/u on renal function Consultants:   none Procedures:   none Antimicrobials:  Anti-infectives (From admission, onward)   None       Objective: Vitals:   11/18/17 2000 11/18/17 2100 11/18/17 2341 11/19/17 0357  BP: (!) 109/97 (!) 149/77 (!) 145/74 (!) 148/74  Pulse: 98 90 80 90  Resp:  18 16 18   Temp:   98.2 F (36.8 C) 97.7 F (36.5 C)  TempSrc:   Oral Oral  SpO2: 97% 97% 98% 99%  Weight:   (!) 140.3 kg (309 lb 3.2 oz)   Height:   6\' 3"  (1.905 m)     Intake/Output Summary (Last 24 hours) at 11/19/2017  1217 Last data filed at 11/19/2017 1128 Gross per 24 hour  Intake 1291.67 ml  Output 200 ml  Net 1091.67 ml   Filed Weights   11/18/17 2341  Weight: (!) 140.3 kg (309 lb 3.2 oz)    Examination: General exam: Appears comfortable  HEENT: PERRLA, oral mucosa moist, no sclera icterus or thrush Respiratory system: Clear to auscultation. Respiratory effort normal. Cardiovascular system: S1 & S2 heard, RRR.   Gastrointestinal system: Abdomen soft, non-tender, nondistended. Normal bowel sound. No organomegaly Central nervous system: Alert and oriented. No focal neurological deficits. Extremities: No cyanosis, clubbing or edema Skin: No rashes or ulcers Psychiatry:  Mood & affect appropriate.                   Data Reviewed: I have personally reviewed following labs and imaging studies  CBC: Recent Labs  Lab 11/18/17 1946 11/19/17 0618  WBC 11.1* 8.4  NEUTROABS 9.0*  --   HGB 12.2* 10.2*  HCT 36.6* 30.7*  MCV 92.9 92.7  PLT 278 161   Basic Metabolic Panel: Recent Labs  Lab 11/18/17 1946 11/19/17 0618  NA 136 137  K 3.7 3.8  CL 105 107  CO2 21* 23  GLUCOSE 109* 105*  BUN 30* 29*  CREATININE 2.09* 2.07*  CALCIUM 8.4* 7.9*   GFR: Estimated Creatinine Clearance: 40.8 mL/min (A) (by C-G formula based on SCr of 2.07 mg/dL (H)). Liver Function Tests: Recent Labs  Lab 11/18/17 1946  AST 116*  ALT 214*  ALKPHOS 175*  BILITOT 1.3*  PROT 6.6  ALBUMIN 3.0*   Recent Labs  Lab 11/18/17 1946  LIPASE 47   No results for input(s): AMMONIA in the last 168 hours. Coagulation Profile: No results for input(s): INR, PROTIME in the last 168 hours. Cardiac Enzymes: No results for input(s): CKTOTAL, CKMB, CKMBINDEX, TROPONINI in the last 168 hours. BNP (last 3 results) No results for input(s): PROBNP in the last 8760 hours. HbA1C: No results for input(s): HGBA1C in the last 72 hours. CBG: No results for input(s): GLUCAP in the last 168 hours. Lipid  Profile: No results for input(s): CHOL, HDL, LDLCALC, TRIG, CHOLHDL, LDLDIRECT in the last 72 hours. Thyroid Function Tests: No results for input(s): TSH, T4TOTAL, FREET4, T3FREE, THYROIDAB in the last 72 hours. Anemia Panel: No results for input(s): VITAMINB12, FOLATE, FERRITIN, TIBC, IRON, RETICCTPCT in the last 72 hours. Urine analysis:    Component Value Date/Time   COLORURINE YELLOW 11/19/2017 [place] 11/19/2017 1103  LABSPEC 1.011 11/19/2017 1103   PHURINE 6.0 11/19/2017 1103   GLUCOSEU NEGATIVE 11/19/2017 1103   HGBUR LARGE (A) 11/19/2017 1103   BILIRUBINUR NEGATIVE 11/19/2017 1103   KETONESUR NEGATIVE 11/19/2017 1103   PROTEINUR 100 (A) 11/19/2017 1103   NITRITE NEGATIVE 11/19/2017 1103   LEUKOCYTESUR TRACE (A) 11/19/2017 1103   Sepsis Labs: @LABRCNTIP (procalcitonin:4,lacticidven:4) ) Recent Results (from the past 240 hour(s))  Aerobic/Anaerobic Culture (surgical/deep wound)     Status: None   Collection Time: 11/10/17  3:34 PM  Result Value Ref Range Status   Specimen Description WOUND RIGHT KNEE  Final   Special Requests NONE  Final   Gram Stain   Final    ABUNDANT WBC PRESENT, PREDOMINANTLY PMN NO ORGANISMS SEEN    Culture   Final    RARE METHICILLIN RESISTANT STAPHYLOCOCCUS AUREUS NO ANAEROBES ISOLATED Performed at [facility], [place] [address], [place], [place] [address]    Report Status 11/15/2017 FINAL  Final   Organism ID, Bacteria METHICILLIN RESISTANT STAPHYLOCOCCUS AUREUS  Final      Susceptibility   Methicillin resistant staphylococcus aureus - MIC*    CIPROFLOXACIN >=8 RESISTANT Resistant     ERYTHROMYCIN >=8 RESISTANT Resistant     GENTAMICIN <=0.5 SENSITIVE Sensitive     OXACILLIN >=4 RESISTANT Resistant     TETRACYCLINE <=1 SENSITIVE Sensitive     VANCOMYCIN <=0.5 SENSITIVE Sensitive     TRIMETH/SULFA >=320 RESISTANT Resistant     CLINDAMYCIN <=0.25 SENSITIVE Sensitive     RIFAMPIN <=0.5 SENSITIVE Sensitive      Inducible Clindamycin NEGATIVE Sensitive     * RARE METHICILLIN RESISTANT STAPHYLOCOCCUS AUREUS         Radiology Studies: Dg Chest 1 View  Result Date: 11/18/2017 CLINICAL DATA:  Shortness of breath. Inability to urinate. Recent knee surgery. EXAM: CHEST  1 VIEW COMPARISON:  04/13/2015 FINDINGS: Two rotated AP portable radiographs of the chest. Midline trachea. Apparent right paratracheal soft tissue fullness is felt to be similar, given differences in obliquity. Tortuous thoracic aorta. Mild cardiomegaly. No pleural effusion or pneumothorax. Hyperinflation. No lobar consolidation. No congestive failure. IMPRESSION: Mild hyperinflation and cardiomegaly, without acute disease. Two rotated frontal radiographs. Apparent right paratracheal soft tissue fullness is likely due to obliquity. When patient is clinically stable, consider PA and lateral radiographs with attention to this area. Electronically Signed   By: Abigail Miyamoto M.D.   On: 11/18/2017 20:03   US Renal  Result Date: 11/18/2017 CLINICAL DATA:  Renal failure.  History of prostate cancer. EXAM: RENAL / URINARY TRACT ULTRASOUND COMPLETE COMPARISON:  CT abdomen and pelvis November 14, 2017 FINDINGS: Right Kidney: Length: 11.4 cm. Echogenicity within normal limits. No mass or hydronephrosis visualized. 12.8 cm anechoic cyst RIGHT perinephric space, possibly arising from the RIGHT kidney. Left Kidney: Length: 12.9 cm. Echogenicity within normal limits. No mass or hydronephrosis visualized. Anechoic cyst LEFT kidney measuring to 5.6 cm. Bladder: Appears normal for degree of bladder distention. However, ureteral jets not demonstrated. IMPRESSION: No obstructive uropathy.  Bilateral renal and/or perirenal cysts. Ureteral jets not demonstrated. Electronically Signed   By: Elon Alas M.D.   On: 11/18/2017 22:43      Scheduled Meds: . heparin  5,000 Units Subcutaneous Q8H  . latanoprost  1 drop Both Eyes QHS  . protein supplement shake  11 oz  Oral Q24H   Continuous Infusions: . sodium chloride 1,000 mL (11/18/17 2340)     LOS: 1 day    Time spent in minutes: 35  Debbe Odea, MD [facility] Pager: [email or web address] Password [facility] 11/19/2017, 12:17 PM

## 2017-11-19 NOTE — Progress Notes (Signed)
Preliminary notes--Right lower extremity venous duplex exam completed. Negative for DVT.    The factors of  Edema , open wound, and body habitus limit the study, cannot completely exclude deep vein thrombosis.   [place]ongying Landry Mellow (RDMS RVT) 11/19/17 4:06 PM

## 2017-11-20 DIAGNOSIS — Z9079 Acquired absence of other genital organ(s): Secondary | ICD-10-CM

## 2017-11-20 DIAGNOSIS — D692 Other nonthrombocytopenic purpura: Secondary | ICD-10-CM

## 2017-11-20 DIAGNOSIS — E869 Volume depletion, unspecified: Secondary | ICD-10-CM

## 2017-11-20 LAB — GASTROINTESTINAL PANEL BY PCR, STOOL (REPLACES STOOL CULTURE)
ADENOVIRUS F40/41: NOT DETECTED
Astrovirus: NOT DETECTED
CAMPYLOBACTER SPECIES: NOT DETECTED
CRYPTOSPORIDIUM: NOT DETECTED
Cyclospora cayetanensis: NOT DETECTED
ENTEROPATHOGENIC E COLI (EPEC): NOT DETECTED
Entamoeba histolytica: NOT DETECTED
Enteroaggregative E coli (EAEC): NOT DETECTED
Enterotoxigenic E coli (ETEC): NOT DETECTED
Giardia lamblia: NOT DETECTED
NOROVIRUS GI/GII: NOT DETECTED
PLESIMONAS SHIGELLOIDES: NOT DETECTED
ROTAVIRUS A: NOT DETECTED
SALMONELLA SPECIES: NOT DETECTED
SHIGELLA/ENTEROINVASIVE E COLI (EIEC): NOT DETECTED
Sapovirus (I, II, IV, and V): DETECTED — AB
Shiga like toxin producing E coli (STEC): NOT DETECTED
Vibrio cholerae: NOT DETECTED
Vibrio species: NOT DETECTED
Yersinia enterocolitica: NOT DETECTED

## 2017-11-20 LAB — URINALYSIS, ROUTINE W REFLEX MICROSCOPIC
Bilirubin Urine: NEGATIVE
GLUCOSE, UA: NEGATIVE mg/dL
KETONES UR: NEGATIVE mg/dL
Leukocytes, UA: NEGATIVE
NITRITE: NEGATIVE
PH: 6 (ref 5.0–8.0)
PROTEIN: NEGATIVE mg/dL
Specific Gravity, Urine: 1.009 (ref 1.005–1.030)

## 2017-11-20 LAB — URINE CULTURE: CULTURE: NO GROWTH

## 2017-11-20 MED ORDER — SODIUM CHLORIDE 0.9 % IV SOLN: 1000.0000 mL | INTRAVENOUS | Status: DC

## 2017-11-20 MED ORDER — ALPRAZOLAM 0.25 MG PO TABS
0.2500 mg | ORAL_TABLET | Freq: Every evening | ORAL | Status: DC | PRN
Start: 2017-11-20 — End: 2017-11-21
  Administered 2017-11-20: 0.25 mg via ORAL
  Filled 2017-11-20: qty 1

## 2017-11-20 NOTE — Progress Notes (Signed)
PROGRESS NOTE    Clinton Mail.   ELF:[id number]  DOB: 1932/08/21  DOA: 11/18/2017 PCP: Ernestene Kiel, MD   Brief Narrative:  Clinton Aikman. is an 84 y/o male with h/o prostate CA s/p radical prostatectomy and issues with a right knee wound over the past several months.   He initially developed a seroma after falling last summer and this was aspirated by Dr Lorin Mercy. 300 cc were removed but it re accumulated a few weeks later and was too thick to aspirate. He then underwent a bursectomy on 12/18 and wound Vac placement but the seroma recurred int he pocket. It was aspirated by Dr Marla Roe on 08/10/17 who recommended excision of the right knee wound and capsule with Vac placement. On 5/1 he underwent excision of the seroma/hematoma with placement of Acell and a wound Vac. He has since had close post op follow up and wound care.  8 wks post op (late June), he was called in Bactrim for the wound which caused severe diarrhea followed by vomiting the next day.  His wound also began to drain and repeat I and D was done on 11/10/17. He was then switched to Doxycycline but continued to do poorly with development of pain when swallowing.   He presented to [facility] on 7/7 and was found to have severe acute pancreatitis (Lipase 17,788, T bili 2.6 and AST 505 and ALT 329) and AKI (Cr 2.10) for which he was treated and then discharged on 7/10. Cr was 1.07 and Lipase was 313 on d/c.   He was seen in follow up at the PCP   During the duration prior to the hospital day he developed a diffuse rash and has yet to find out what caused it. The rash has not worsened and he is unable to tell if it is improving. It does not itch.  He presents to the ED for inability to urinate for 2 days and swelling that is worse in his right leg. He states his leg has swollen up to 3 times the size of his left leg.    Cr was 2.09, AST 116, ALT 214, T bili 1.3, WBC 11.1, UA showed > 50 RBC, rare bacteria and trace  leukocytes. Renal ultrasound bilateral renal or perirenal cysts but no other abnormalities.   MRSA noted in wound from I and of Knee on 7/3. MRSA PCR is negative.    Subjective: He is urinating very well today. He is still has diarrhea.   Assessment & Plan:   Principal Problem:   Anuria   AKI (acute kidney injury) - urine output significantly improved but renal function unchanged- cont to hold Lasix - cut fluids back to 50 cc/hr  Diarrhea and poor appetitie - apparently started with Bactrim and has not resolved -  Will try to obtain CT abd/pelvis from [place] -  c diff was negative at [place] and is negative again today - f/u on GI pathogen panel    H/O prostate cancer   S/P prostatectomy - he received a dose of Lupron on 07/01/17 with a plan for f/u PSA in 6 months - he is demanding we check a PSA now-  PSA is 0.15  Hematuria with h/o prostatectomy  - renal ultrasound unrevealing- he will need to f/u with urology   Right knee infection s/p bursectomy x 2 - Dr Marla Roe consulted- she has seen him today and he states she told him that the knee looks good    Rash -  maculo-papular rash which is purpuric in areas (see below)- it is not itchy and is not getting worse- he is not the one who noticed the rash (it was noticed when he was admitted in [facility]) and he cannot tell me if it is getting better    Severe malnutrition - started Premeir protein   DVT prophylaxis: Heparin Code Status: Full code Family Communication: wife, daughter and granddaughter  Disposition Plan: f/u on renal function Consultants:   none Procedures:   none Antimicrobials:  Anti-infectives (From admission, onward)   None     Objective: Vitals:   11/19/17 1820 11/19/17 2104 11/20/17 0441 11/20/17 1351  BP: (!) 169/85 (!) 149/69 (!) 157/75 (!) 156/89  Pulse: 83 87 88 84  Resp: 20 15 18 16   Temp: 98.1 F (36.7 C) 98.1 F (36.7 C) 97.9 F (36.6 C) 97.7 F (36.5 C)  TempSrc: Oral Oral  Oral Oral  SpO2: 98% 96% 100% 100%  Weight:      Height:        Intake/Output Summary (Last 24 hours) at 11/20/2017 1423 Last data filed at 11/20/2017 1100 Gross per 24 hour  Intake 800 ml  Output 625 ml  Net 175 ml   Filed Weights   11/18/17 2341  Weight: (!) 140.3 kg (309 lb 3.2 oz)    Examination: General exam: Appears comfortable  HEENT: PERRLA, oral mucosa moist, no sclera icterus or thrush Respiratory system: Clear to auscultation. Respiratory effort normal. Cardiovascular system: S1 & S2 heard, RRR.   Gastrointestinal system: Abdomen soft, non-tender, nondistended. Normal bowel sound. No organomegaly Central nervous system: Alert and oriented. No focal neurological deficits. Extremities: No cyanosis, clubbing or edema Skin: No rashes or ulcers Psychiatry:  Mood & affect appropriate.                   Data Reviewed: I have personally reviewed following labs and imaging studies  CBC: Recent Labs  Lab 11/18/17 1946 11/19/17 0618  WBC 11.1* 8.4  NEUTROABS 9.0*  --   HGB 12.2* 10.2*  HCT 36.6* 30.7*  MCV 92.9 92.7  PLT 278 630   Basic Metabolic Panel: Recent Labs  Lab 11/18/17 1946 11/19/17 0618 11/19/17 1605  NA 136 137 140  K 3.7 3.8 4.2  CL 105 107 109  CO2 21* 23 24  GLUCOSE 109* 105* 127*  BUN 30* 29* 31*  CREATININE 2.09* 2.07* 2.04*  CALCIUM 8.4* 7.9* 8.0*   GFR: Estimated Creatinine Clearance: 41.4 mL/min (A) (by C-G formula based on SCr of 2.04 mg/dL (H)). Liver Function Tests: Recent Labs  Lab 11/18/17 1946 11/19/17 1605  AST 116* 70*  ALT 214* 137*  ALKPHOS 175* 136*  BILITOT 1.3* 0.9  PROT 6.6 5.7*  ALBUMIN 3.0* 2.6*   Recent Labs  Lab 11/18/17 1946  LIPASE 47   No results for input(s): AMMONIA in the last 168 hours. Coagulation Profile: No results for input(s): INR, PROTIME in the last 168 hours. Cardiac Enzymes: No results for input(s): CKTOTAL, CKMB, CKMBINDEX, TROPONINI in the last 168 hours. BNP (last 3  results) No results for input(s): PROBNP in the last 8760 hours. HbA1C: No results for input(s): HGBA1C in the last 72 hours. CBG: No results for input(s): GLUCAP in the last 168 hours. Lipid Profile: No results for input(s): CHOL, HDL, LDLCALC, TRIG, CHOLHDL, LDLDIRECT in the last 72 hours. Thyroid Function Tests: No results for input(s): TSH, T4TOTAL, FREET4, T3FREE, THYROIDAB in the last 72 hours. Anemia Panel: No results for  input(s): VITAMINB12, FOLATE, FERRITIN, TIBC, IRON, RETICCTPCT in the last 72 hours. Urine analysis:    Component Value Date/Time   COLORURINE YELLOW 11/20/2017 0910   APPEARANCEUR CLEAR 11/20/2017 0910   LABSPEC 1.009 11/20/2017 0910   PHURINE 6.0 11/20/2017 0910   GLUCOSEU NEGATIVE 11/20/2017 0910   HGBUR LARGE (A) 11/20/2017 0910   BILIRUBINUR NEGATIVE 11/20/2017 0910   KETONESUR NEGATIVE 11/20/2017 0910   PROTEINUR NEGATIVE 11/20/2017 0910   NITRITE NEGATIVE 11/20/2017 0910   LEUKOCYTESUR NEGATIVE 11/20/2017 0910   Sepsis Labs: @LABRCNTIP (procalcitonin:4,lacticidven:4) ) Recent Results (from the past 240 hour(s))  Aerobic/Anaerobic Culture (surgical/deep wound)     Status: None   Collection Time: 11/10/17  3:34 PM  Result Value Ref Range Status   Specimen Description WOUND RIGHT KNEE  Final   Special Requests NONE  Final   Gram Stain   Final    ABUNDANT WBC PRESENT, PREDOMINANTLY PMN NO ORGANISMS SEEN    Culture   Final    RARE METHICILLIN RESISTANT STAPHYLOCOCCUS AUREUS NO ANAEROBES ISOLATED Performed at [facility], [place] [address] [place], [place] [address]    Report Status 11/15/2017 FINAL  Final   Organism ID, Bacteria METHICILLIN RESISTANT STAPHYLOCOCCUS AUREUS  Final      Susceptibility   Methicillin resistant staphylococcus aureus - MIC*    CIPROFLOXACIN >=8 RESISTANT Resistant     ERYTHROMYCIN >=8 RESISTANT Resistant     GENTAMICIN <=0.5 SENSITIVE Sensitive     OXACILLIN >=4 RESISTANT Resistant     TETRACYCLINE <=1  SENSITIVE Sensitive     VANCOMYCIN <=0.5 SENSITIVE Sensitive     TRIMETH/SULFA >=320 RESISTANT Resistant     CLINDAMYCIN <=0.25 SENSITIVE Sensitive     RIFAMPIN <=0.5 SENSITIVE Sensitive     Inducible Clindamycin NEGATIVE Sensitive     * RARE METHICILLIN RESISTANT STAPHYLOCOCCUS AUREUS  MRSA PCR Screening     Status: None   Collection Time: 11/19/17  8:54 AM  Result Value Ref Range Status   MRSA by PCR NEGATIVE NEGATIVE Final    Comment:        The GeneXpert MRSA Assay (FDA approved for NASAL specimens only), is one component of a comprehensive MRSA colonization surveillance program. It is not intended to diagnose MRSA infection nor to guide or monitor treatment for MRSA infections. Performed at [facility], [place] [address] [place], [place] [address]   Urine culture     Status: None   Collection Time: 11/19/17 11:04 AM  Result Value Ref Range Status   Specimen Description   Final    URINE, CLEAN CATCH Performed at [facility], [place] [address], [place], [place] [address]    Special Requests   Final    NONE Performed at [facility], [place] [address], [place], [place] [address]    Culture   Final    NO GROWTH Performed at [facility], [place] [address] [place], [place] [address]    Report Status 11/20/2017 FINAL  Final  C difficile quick scan w PCR reflex     Status: None   Collection Time: 11/19/17  2:20 PM  Result Value Ref Range Status   C Diff antigen NEGATIVE NEGATIVE Final   C Diff toxin NEGATIVE NEGATIVE Final   C Diff interpretation No C. difficile detected.  Final    Comment: Performed at [facility], [place] [address], [place], [place] [address]         Radiology Studies: Dg Chest 1  View  Result Date: 11/18/2017 CLINICAL DATA:  Shortness of breath. Inability to urinate. Recent knee surgery. EXAM: CHEST  1 VIEW COMPARISON:  04/13/2015 FINDINGS: Two rotated AP portable  radiographs of the chest. Midline trachea. Apparent right paratracheal soft tissue fullness is felt to be similar, given differences in obliquity. Tortuous thoracic aorta. Mild cardiomegaly. No pleural effusion or pneumothorax. Hyperinflation. No lobar consolidation. No congestive failure. IMPRESSION: Mild hyperinflation and cardiomegaly, without acute disease. Two rotated frontal radiographs. Apparent right paratracheal soft tissue fullness is likely due to obliquity. When patient is clinically stable, consider PA and lateral radiographs with attention to this area. Electronically Signed   By: Abigail Miyamoto M.D.   On: 11/18/2017 20:03   US Renal  Result Date: 11/18/2017 CLINICAL DATA:  Renal failure.  History of prostate cancer. EXAM: RENAL / URINARY TRACT ULTRASOUND COMPLETE COMPARISON:  CT abdomen and pelvis November 14, 2017 FINDINGS: Right Kidney: Length: 11.4 cm. Echogenicity within normal limits. No mass or hydronephrosis visualized. 12.8 cm anechoic cyst RIGHT perinephric space, possibly arising from the RIGHT kidney. Left Kidney: Length: 12.9 cm. Echogenicity within normal limits. No mass or hydronephrosis visualized. Anechoic cyst LEFT kidney measuring to 5.6 cm. Bladder: Appears normal for degree of bladder distention. However, ureteral jets not demonstrated. IMPRESSION: No obstructive uropathy.  Bilateral renal and/or perirenal cysts. Ureteral jets not demonstrated. Electronically Signed   By: Elon Alas M.D.   On: 11/18/2017 22:43      Scheduled Meds: . heparin  5,000 Units Subcutaneous Q8H  . latanoprost  1 drop Both Eyes QHS  . protein supplement shake  11 oz Oral Q24H   Continuous Infusions: . sodium chloride 50 mL/hr at 11/20/17 1050     LOS: 2 days    Time spent in minutes: 35    Debbe Odea, MD [facility] Pager: [email or web address] Password [facility] 11/20/2017, 2:23 PM

## 2017-11-21 DIAGNOSIS — A0811 Acute gastroenteropathy due to Norwalk agent: Secondary | ICD-10-CM

## 2017-11-21 LAB — COMPREHENSIVE METABOLIC PANEL
ALT: 77 U/L — ABNORMAL HIGH (ref 0–44)
AST: 36 U/L (ref 15–41)
Albumin: 2.5 g/dL — ABNORMAL LOW (ref 3.5–5.0)
Alkaline Phosphatase: 107 U/L (ref 38–126)
Anion gap: 8 (ref 5–15)
BILIRUBIN TOTAL: 0.9 mg/dL (ref 0.3–1.2)
BUN: 25 mg/dL — AB (ref 8–23)
CHLORIDE: 113 mmol/L — AB (ref 98–111)
CO2: 20 mmol/L — ABNORMAL LOW (ref 22–32)
Calcium: 7.9 mg/dL — ABNORMAL LOW (ref 8.9–10.3)
Creatinine, Ser: 1.82 mg/dL — ABNORMAL HIGH (ref 0.61–1.24)
GFR, EST AFRICAN AMERICAN: 38 mL/min — AB (ref >60)
GFR, EST NON AFRICAN AMERICAN: 33 mL/min — AB (ref >60)
Glucose, Bld: 118 mg/dL — ABNORMAL HIGH (ref 70–99)
POTASSIUM: 3.7 mmol/L (ref 3.5–5.1)
Sodium: 141 mmol/L (ref 135–145)
TOTAL PROTEIN: 5.5 g/dL — AB (ref 6.5–8.1)

## 2017-11-21 MED ORDER — GI COCKTAIL ~~LOC~~
30.0000 mL | Freq: Three times a day (TID) | ORAL | Status: DC | PRN
  Administered 2017-11-21: 30 mL via ORAL
  Filled 2017-11-21: qty 30

## 2017-11-21 MED ORDER — PREMIER PROTEIN SHAKE: 11.0000 [oz_av] | Freq: Three times a day (TID) | ORAL | 0 refills | Status: DC

## 2017-11-21 MED ORDER — SIMETHICONE 80 MG PO CHEW: 80.0000 mg | CHEWABLE_TABLET | Freq: Four times a day (QID) | ORAL | 2 refills | Status: DC | PRN

## 2017-11-21 MED ORDER — DICYCLOMINE HCL 10 MG PO CAPS: 10.0000 mg | ORAL_CAPSULE | Freq: Three times a day (TID) | ORAL | 0 refills | Status: DC | PRN

## 2017-11-21 MED ORDER — DICYCLOMINE HCL 10 MG PO CAPS
10.0000 mg | ORAL_CAPSULE | Freq: Three times a day (TID) | ORAL | Status: DC
  Administered 2017-11-21: 10 mg via ORAL
  Filled 2017-11-21: qty 1

## 2017-11-21 MED ORDER — ONDANSETRON HCL 4 MG PO TABS: 4.0000 mg | ORAL_TABLET | Freq: Four times a day (QID) | ORAL | 0 refills | Status: DC | PRN

## 2017-11-21 NOTE — Discharge Summary (Signed)
Physician Discharge Summary  [name]. LPF:[id number] DOB: 02/01/1933 DOA: 11/18/2017  PCP: Ernestene Kiel, MD  Admit date: 11/18/2017 Discharge date: 11/21/2017  Admitted From: home Disposition:  home   Recommendations for Outpatient Follow-up:  1. F/u on diarrhea and abdominal pain    Discharge Condition:  stable   CODE STATUS:  Full code   Consultations:  none    Discharge Diagnoses:  Principal Problem:   Anuria Active Problems:   Volume depletion   Norovirus   AKI (acute kidney injury) ([place])   H/O prostate cancer   S/P prostatectomy   Diarrhea   Rash    Clinton Shea. is an 84 y/o male with h/o prostate CA s/p radical prostatectomy and issues with a right knee wound over the past several months.   He initially developed a seroma after falling last summer and this was aspirated by Dr Lorin Mercy. 300 cc were removed but it re accumulated a few weeks later and was too thick to aspirate. He then underwent a bursectomy on 12/18 and wound Vac placement but the seroma recurred int he pocket. It was aspirated by Dr Marla Roe on 08/10/17 who recommended excision of the right knee wound and capsule with Vac placement. On 5/1 he underwent excision of the seroma/hematoma with placement of Acell and a wound Vac. He has since had close post op follow up and wound care.  8 wks post op (late June), he was called in Bactrim for the wound which caused severe diarrhea followed by vomiting the next day.  His wound also began to drain and repeat I and D was done on 11/10/17. He was then switched to Doxycycline but continued to do poorly with development of pain when swallowing.   He presented to [facility] on 7/7 and was found to have severe acute pancreatitis (Lipase 17,788, T bili 2.6 and AST 505 and ALT 329) and AKI (Cr 2.10) for which he was treated and then discharged on 7/10. Cr was 1.07 and Lipase was 313 on d/c.   He was seen in follow up at the PCP   During the duration  prior to the hospital day he developed a diffuse rash and has yet to find out what caused it. The rash has not worsened and he is unable to tell if it is improving. It does not itch.  He presents to the ED for inability to urinate for 2 days and swelling that is worse in his right leg. He states his leg has swollen up to 3 times the size of his left leg.    Cr was 2.09, AST 116, ALT 214, T bili 1.3, WBC 11.1, UA showed > 50 RBC, rare bacteria and trace leukocytes. Renal ultrasound bilateral renal or perirenal cysts but no other abnormalities.   MRSA noted in wound from I and of Knee on 7/3. MRSA PCR is negative.    Principal Problem:   Anuria   AKI (acute kidney injury) - urine output significantly improved after agresive hydration and renal function improved as well - cont to hold Lasix which he takes for pedal edema especially as oral intake is poor- - he states he had difficult putting on compression hose on his right leg - I have told him to apply ACE wraps to right leg and TEDs to left leg- RN has done if for him prior to d/c   Diarrhea and poor appetitie - apparently this started with Bactrim and has not resolved - he was  admitted to [facility] as mentioned above for severe acute pancreatitis and elevated LFTs with uncertain etiology - LFTs are steadily improving - C diff was negative at [place] and is negative again   - GI pathogen panel shows Sapovirus which is in the Norovirus family - he further admits that he has been told that he may have IBS- I have ordered Bentyl and Gas X for him as he is having occasional severe, crampy abdominal pain - he has an appt coming up with a gastroenterologist and is insisting on going home today - he know to stop his Lasix and ensure he is taking his protein shakes and adequate fluid intake- his family is aware as well  Chronic pedal edema - right leg (with wound) is much larger than left- ultrasound neg for DVT - stop Lasix- apply compression  stockings and ACE wraps    H/O prostate cancer   S/P prostatectomy - he received a dose of Lupron on 07/01/17 with a plan for f/u PSA in 6 months - he is demanding we check a PSA during this admission-  PSA is 0.15 - he further states his urologist recently prescribed Flomax for him but he has not had time to fill it- he has been voiding well in the hospital but has hematuria  Hematuria with h/o prostatectomy  - renal ultrasound unrevealing- he will need to f/u with urology   Right knee infection s/p bursectomy x 2 - Dr Marla Roe consulted- she has seen him in the hospital and he states she told him that the knee wound looks great and is healing well- no note in computer    Rash - maculo-papular rash which is purpuric in areas (see below)- it is not itchy and is not getting worse- he is not the one who noticed the rash (it was noticed when he was admitted in [place]) and he cannot tell me if it is getting better    Severe malnutrition - started Premeir protein    Discharge Exam: Vitals:   11/21/17 0602 11/21/17 1356  BP: (!) 159/76 (!) 141/86  Pulse: 84 85  Resp: 20 16  Temp: 98 F (36.7 C) 98.1 F (36.7 C)  SpO2: 100% 99%   Vitals:   11/20/17 1351 11/20/17 2258 11/21/17 0602 11/21/17 1356  BP: (!) 156/89 (!) 172/79 (!) 159/76 (!) 141/86  Pulse: 84 86 84 85  Resp: 16 18 20 16   Temp: 97.7 F (36.5 C) 98.2 F (36.8 C) 98 F (36.7 C) 98.1 F (36.7 C)  TempSrc: Oral Oral Oral Oral  SpO2: 100% 99% 100% 99%  Weight:      Height:        General: Pt is alert, awake, not in acute distress Cardiovascular: RRR, S1/S2 +, no rubs, no gallops Respiratory: CTA bilaterally, no wheezing, no rhonchi Abdominal: Soft, NT, ND, bowel sounds + Extremities: no edema, no cyanosis Skin: rash on arms and legs which is macular/papular in some areas and petechial and purpuric in others (see pictures on my prior note)   Discharge Instructions  Discharge Instructions    Diet - low  sodium heart healthy   Complete by:  As directed    Increase activity slowly   Complete by:  As directed      Allergies as of 11/21/2017      Reactions   Tylenol [acetaminophen]    Severe indigestion   Sulfa Antibiotics Nausea Only   Burning esophagus      Medication List  STOP taking these medications   feeding supplement Liqd   furosemide 40 MG tablet Commonly known as:  LASIX     TAKE these medications   dicyclomine 10 MG capsule Commonly known as:  BENTYL Take 1 capsule (10 mg total) by mouth 3 (three) times daily with meals as needed for spasms.   hydrocortisone cream 1 % Apply 1 application topically 2 (two) times daily.   latanoprost 0.005 % ophthalmic solution Commonly known as:  XALATAN Place 1 drop into both eyes at bedtime.   ondansetron 4 MG tablet Commonly known as:  ZOFRAN Take 1 tablet (4 mg total) by mouth every 6 (six) hours as needed for nausea.   protein supplement shake Liqd Commonly known as:  PREMIER PROTEIN Take 325 mLs (11 oz total) by mouth 3 (three) times daily between meals.   simethicone 80 MG chewable tablet Commonly known as:  GAS-X Chew 1 tablet (80 mg total) by mouth 4 (four) times daily as needed for flatulence.       Allergies  Allergen Reactions  . Tylenol [Acetaminophen]     Severe indigestion  . Sulfa Antibiotics Nausea Only    Burning esophagus     Procedures/Studies:  Venous Duplex: Right lower extremity venous duplex exam completed. Negative for DVT.    The factors of  Edema , open wound, and body habitus limit the study, cannot completely exclude deep vein thrombosis.   Dg Chest 1 View  Result Date: 11/18/2017 CLINICAL DATA:  Shortness of breath. Inability to urinate. Recent knee surgery. EXAM: CHEST  1 VIEW COMPARISON:  04/13/2015 FINDINGS: Two rotated AP portable radiographs of the chest. Midline trachea. Apparent right paratracheal soft tissue fullness is felt to be similar, given differences in obliquity.  Tortuous thoracic aorta. Mild cardiomegaly. No pleural effusion or pneumothorax. Hyperinflation. No lobar consolidation. No congestive failure. IMPRESSION: Mild hyperinflation and cardiomegaly, without acute disease. Two rotated frontal radiographs. Apparent right paratracheal soft tissue fullness is likely due to obliquity. When patient is clinically stable, consider PA and lateral radiographs with attention to this area. Electronically Signed   By: Abigail Miyamoto M.D.   On: 11/18/2017 20:03   US Renal  Result Date: 11/18/2017 CLINICAL DATA:  Renal failure.  History of prostate cancer. EXAM: RENAL / URINARY TRACT ULTRASOUND COMPLETE COMPARISON:  CT abdomen and pelvis November 14, 2017 FINDINGS: Right Kidney: Length: 11.4 cm. Echogenicity within normal limits. No mass or hydronephrosis visualized. 12.8 cm anechoic cyst RIGHT perinephric space, possibly arising from the RIGHT kidney. Left Kidney: Length: 12.9 cm. Echogenicity within normal limits. No mass or hydronephrosis visualized. Anechoic cyst LEFT kidney measuring to 5.6 cm. Bladder: Appears normal for degree of bladder distention. However, ureteral jets not demonstrated. IMPRESSION: No obstructive uropathy.  Bilateral renal and/or perirenal cysts. Ureteral jets not demonstrated. Electronically Signed   By: Elon Alas M.D.   On: 11/18/2017 22:43     The results of significant diagnostics from this hospitalization (including imaging, microbiology, ancillary and laboratory) are listed below for reference.     Microbiology: Recent Results (from the past 240 hour(s))  MRSA PCR Screening     Status: None   Collection Time: 11/19/17  8:54 AM  Result Value Ref Range Status   MRSA by PCR NEGATIVE NEGATIVE Final    Comment:        The GeneXpert MRSA Assay (FDA approved for NASAL specimens only), is one component of a comprehensive MRSA colonization surveillance program. It is not intended to diagnose MRSA infection  nor to guide or monitor  treatment for MRSA infections. Performed at [facility], [place] [address], [place], [place] [address]   Urine culture     Status: None   Collection Time: 11/19/17 11:04 AM  Result Value Ref Range Status   Specimen Description   Final    URINE, CLEAN CATCH Performed at [facility], [place] [address], [place], [place] [address]    Special Requests   Final    NONE Performed at [facility], [place] [address], [place], [place] [address]    Culture   Final    NO GROWTH Performed at [facility], [place] [address] [place], [place] [address]    Report Status 11/20/2017 FINAL  Final  C difficile quick scan w PCR reflex     Status: None   Collection Time: 11/19/17  2:20 PM  Result Value Ref Range Status   C Diff antigen NEGATIVE NEGATIVE Final   C Diff toxin NEGATIVE NEGATIVE Final   C Diff interpretation No C. difficile detected.  Final    Comment: Performed at [facility], [place] [address] [place], [place] [address]  Gastrointestinal Panel by PCR , Stool     Status: Abnormal   Collection Time: 11/19/17  2:20 PM  Result Value Ref Range Status   Campylobacter species NOT DETECTED NOT DETECTED Final   Plesimonas shigelloides NOT DETECTED NOT DETECTED Final   Salmonella species NOT DETECTED NOT DETECTED Final   Yersinia enterocolitica NOT DETECTED NOT DETECTED Final   Vibrio species NOT DETECTED NOT DETECTED Final   Vibrio cholerae NOT DETECTED NOT DETECTED Final   Enteroaggregative E coli (EAEC) NOT DETECTED NOT DETECTED Final   Enteropathogenic E coli (EPEC) NOT DETECTED NOT DETECTED Final   Enterotoxigenic E coli (ETEC) NOT DETECTED NOT DETECTED Final   Shiga like toxin producing E coli (STEC) NOT DETECTED NOT DETECTED Final   Shigella/Enteroinvasive E coli (EIEC) NOT DETECTED NOT DETECTED Final   Cryptosporidium NOT DETECTED NOT DETECTED Final   Cyclospora cayetanensis NOT DETECTED NOT DETECTED Final    Entamoeba histolytica NOT DETECTED NOT DETECTED Final   Giardia lamblia NOT DETECTED NOT DETECTED Final   Adenovirus F40/41 NOT DETECTED NOT DETECTED Final   Astrovirus NOT DETECTED NOT DETECTED Final   Norovirus GI/GII NOT DETECTED NOT DETECTED Final   Rotavirus A NOT DETECTED NOT DETECTED Final   Sapovirus (I, II, IV, and V) DETECTED (A) NOT DETECTED Final    Comment: Performed at [facility], [place] [place],  [address]     Labs: BNP (last 3 results) Recent Labs    11/18/17 1946  BNP 23.5   Basic Metabolic Panel: Recent Labs  Lab 11/18/17 1946 11/19/17 0618 11/19/17 1605 11/21/17 0750  NA 136 137 140 141  K 3.7 3.8 4.2 3.7  CL 105 107 109 113*  CO2 21* 23 24 20*  GLUCOSE 109* 105* 127* 118*  BUN 30* 29* 31* 25*  CREATININE 2.09* 2.07* 2.04* 1.82*  CALCIUM 8.4* 7.9* 8.0* 7.9*   Liver Function Tests: Recent Labs  Lab 11/18/17 1946 11/19/17 1605 11/21/17 0750  AST 116* 70* 36  ALT 214* 137* 77*  ALKPHOS 175* 136* 107  BILITOT 1.3* 0.9 0.9  PROT 6.6 5.7* 5.5*  ALBUMIN 3.0* 2.6* 2.5*   Recent Labs  Lab 11/18/17 1946  LIPASE 47   No results for input(s): AMMONIA in the last 168 hours. CBC: Recent Labs  Lab 11/18/17  1946 11/19/17 0618  WBC 11.1* 8.4  NEUTROABS 9.0*  --   HGB 12.2* 10.2*  HCT 36.6* 30.7*  MCV 92.9 92.7  PLT 278 239   Cardiac Enzymes: No results for input(s): CKTOTAL, CKMB, CKMBINDEX, TROPONINI in the last 168 hours. BNP: Invalid input(s): POCBNP CBG: No results for input(s): GLUCAP in the last 168 hours. D-Dimer No results for input(s): DDIMER in the last 72 hours. Hgb A1c No results for input(s): HGBA1C in the last 72 hours. Lipid Profile No results for input(s): CHOL, HDL, LDLCALC, TRIG, CHOLHDL, LDLDIRECT in the last 72 hours. Thyroid function studies No results for input(s): TSH, T4TOTAL, T3FREE, THYROIDAB in the last 72 hours.  Invalid input(s): FREET3 Anemia work up No results for input(s):  VITAMINB12, FOLATE, FERRITIN, TIBC, IRON, RETICCTPCT in the last 72 hours. Urinalysis    Component Value Date/Time   COLORURINE YELLOW 11/20/2017 0910   APPEARANCEUR CLEAR 11/20/2017 0910   LABSPEC 1.009 11/20/2017 0910   PHURINE 6.0 11/20/2017 0910   GLUCOSEU NEGATIVE 11/20/2017 0910   HGBUR LARGE (A) 11/20/2017 0910   BILIRUBINUR NEGATIVE 11/20/2017 0910   KETONESUR NEGATIVE 11/20/2017 0910   PROTEINUR NEGATIVE 11/20/2017 0910   NITRITE NEGATIVE 11/20/2017 0910   LEUKOCYTESUR NEGATIVE 11/20/2017 0910   Sepsis Labs Invalid input(s): PROCALCITONIN,  WBC,  LACTICIDVEN Microbiology Recent Results (from the past 240 hour(s))  MRSA PCR Screening     Status: None   Collection Time: 11/19/17  8:54 AM  Result Value Ref Range Status   MRSA by PCR NEGATIVE NEGATIVE Final    Comment:        The GeneXpert MRSA Assay (FDA approved for NASAL specimens only), is one component of a comprehensive MRSA colonization surveillance program. It is not intended to diagnose MRSA infection nor to guide or monitor treatment for MRSA infections. Performed at [facility], [place] [address] [place], [place] [address]   Urine culture     Status: None   Collection Time: 11/19/17 11:04 AM  Result Value Ref Range Status   Specimen Description   Final    URINE, CLEAN CATCH Performed at [facility], [place] [address], [place], [place] [address]    Special Requests   Final    NONE Performed at [facility], [place] [address], [place], [place] [address]    Culture   Final    NO GROWTH Performed at [facility], [place] [address] [place], [place] [address]    Report Status 11/20/2017 FINAL  Final  C difficile quick scan w PCR reflex     Status: None   Collection Time: 11/19/17  2:20 PM  Result Value Ref Range Status   C Diff antigen NEGATIVE NEGATIVE Final   C Diff toxin NEGATIVE NEGATIVE Final   C Diff interpretation No C. difficile detected.   Final    Comment: Performed at [facility], [place] [address], [place], [place] [address]  Gastrointestinal Panel by PCR , Stool     Status: Abnormal   Collection Time: 11/19/17  2:20 PM  Result Value Ref Range Status   Campylobacter species NOT DETECTED NOT DETECTED Final   Plesimonas shigelloides NOT DETECTED NOT DETECTED Final   Salmonella species NOT DETECTED NOT DETECTED Final   Yersinia enterocolitica NOT DETECTED NOT DETECTED Final   Vibrio species NOT DETECTED NOT DETECTED Final   Vibrio cholerae NOT DETECTED NOT DETECTED Final   Enteroaggregative E coli (EAEC) NOT DETECTED NOT DETECTED Final  Enteropathogenic E coli (EPEC) NOT DETECTED NOT DETECTED Final   Enterotoxigenic E coli (ETEC) NOT DETECTED NOT DETECTED Final   Shiga like toxin producing E coli (STEC) NOT DETECTED NOT DETECTED Final   Shigella/Enteroinvasive E coli (EIEC) NOT DETECTED NOT DETECTED Final   Cryptosporidium NOT DETECTED NOT DETECTED Final   Cyclospora cayetanensis NOT DETECTED NOT DETECTED Final   Entamoeba histolytica NOT DETECTED NOT DETECTED Final   Giardia lamblia NOT DETECTED NOT DETECTED Final   Adenovirus F40/41 NOT DETECTED NOT DETECTED Final   Astrovirus NOT DETECTED NOT DETECTED Final   Norovirus GI/GII NOT DETECTED NOT DETECTED Final   Rotavirus A NOT DETECTED NOT DETECTED Final   Sapovirus (I, II, IV, and V) DETECTED (A) NOT DETECTED Final    Comment: Performed at [facility], [address], [place], [place] [address]     Time coordinating discharge in minutes: 65  SIGNED:   Debbe Odea, MD  [facility] 11/21/2017, 2:07 PM Pager   If 7PM-7AM, please contact night-coverage [email or web address] Password [email or web address]

## 2017-11-21 NOTE — Discharge Instructions (Signed)
Please follow up with urology as you have blood in you urine.   You were cared for by a hospitalist during your hospital stay. If you have any questions about your discharge medications or the care you received while you were in the hospital after you are discharged, you can call the unit and asked to speak with the hospitalist on call if the hospitalist that took care of you is not available. Once you are discharged, your primary care physician will handle any further medical issues. Please note that NO REFILLS for any discharge medications will be authorized once you are discharged, as it is imperative that you return to your primary care physician (or establish a relationship with a primary care physician if you do not have one) for your aftercare needs so that they can reassess your need for medications and monitor your lab values.  Please take all your medications with you for your next visit with your Primary MD. Please ask your Primary MD to get all Hospital records sent to his/her office. Please request your Primary MD to go over all hospital test results at the follow up.    If you experience worsening of your admission symptoms, develop shortness of breath, chest pain, suicidal or homicidal thoughts or a life threatening emergency, you must seek medical attention immediately by calling 911 or calling your MD.   Dennis Bast must read the complete instructions/literature along with all the possible adverse reactions/side effects for all the medicines you take including new medications that have been prescribed to you. Take new medicines after you have completely understood and accpet all the possible adverse reactions/side effects.    Do not drive when taking pain medications or sedatives.     Do not take more than prescribed Pain, Sleep and Anxiety Medications   If you have smoked or chewed Tobacco in the last 2 yrs please stop. Stop any regular alcohol  and or recreational drug use.   Wear Seat  belts while driving.

## 2017-11-21 NOTE — Progress Notes (Signed)
Patient walked 200 feet in hall with walker without difficulty.

## 2017-11-24 DIAGNOSIS — S81001D Unspecified open wound, right knee, subsequent encounter: Secondary | ICD-10-CM | POA: Diagnosis not present

## 2017-11-24 DIAGNOSIS — M545 Low back pain: Secondary | ICD-10-CM | POA: Diagnosis not present

## 2017-11-24 DIAGNOSIS — Z9181 History of falling: Secondary | ICD-10-CM | POA: Diagnosis not present

## 2017-11-24 DIAGNOSIS — Z79891 Long term (current) use of opiate analgesic: Secondary | ICD-10-CM | POA: Diagnosis not present

## 2017-11-24 DIAGNOSIS — G8929 Other chronic pain: Secondary | ICD-10-CM | POA: Diagnosis not present

## 2017-11-26 DIAGNOSIS — M7989 Other specified soft tissue disorders: Secondary | ICD-10-CM | POA: Diagnosis not present

## 2017-11-26 DIAGNOSIS — E46 Unspecified protein-calorie malnutrition: Secondary | ICD-10-CM | POA: Diagnosis not present

## 2017-11-26 DIAGNOSIS — F419 Anxiety disorder, unspecified: Secondary | ICD-10-CM | POA: Diagnosis not present

## 2017-11-26 DIAGNOSIS — A09 Infectious gastroenteritis and colitis, unspecified: Secondary | ICD-10-CM | POA: Diagnosis not present

## 2017-11-26 DIAGNOSIS — R5383 Other fatigue: Secondary | ICD-10-CM | POA: Diagnosis not present

## 2017-11-29 DIAGNOSIS — G8929 Other chronic pain: Secondary | ICD-10-CM | POA: Diagnosis not present

## 2017-11-29 DIAGNOSIS — S81001A Unspecified open wound, right knee, initial encounter: Secondary | ICD-10-CM | POA: Diagnosis not present

## 2017-11-29 DIAGNOSIS — M545 Low back pain: Secondary | ICD-10-CM | POA: Diagnosis not present

## 2017-11-29 DIAGNOSIS — Z4789 Encounter for other orthopedic aftercare: Secondary | ICD-10-CM | POA: Diagnosis not present

## 2017-11-29 DIAGNOSIS — K859 Acute pancreatitis without necrosis or infection, unspecified: Secondary | ICD-10-CM | POA: Diagnosis not present

## 2017-11-29 DIAGNOSIS — N400 Enlarged prostate without lower urinary tract symptoms: Secondary | ICD-10-CM | POA: Diagnosis not present

## 2017-12-02 DIAGNOSIS — G8929 Other chronic pain: Secondary | ICD-10-CM | POA: Diagnosis not present

## 2017-12-02 DIAGNOSIS — S81001A Unspecified open wound, right knee, initial encounter: Secondary | ICD-10-CM | POA: Diagnosis not present

## 2017-12-02 DIAGNOSIS — M545 Low back pain: Secondary | ICD-10-CM | POA: Diagnosis not present

## 2017-12-02 DIAGNOSIS — Z4789 Encounter for other orthopedic aftercare: Secondary | ICD-10-CM | POA: Diagnosis not present

## 2017-12-02 DIAGNOSIS — N400 Enlarged prostate without lower urinary tract symptoms: Secondary | ICD-10-CM | POA: Diagnosis not present

## 2017-12-02 DIAGNOSIS — K859 Acute pancreatitis without necrosis or infection, unspecified: Secondary | ICD-10-CM | POA: Diagnosis not present

## 2017-12-06 DIAGNOSIS — K859 Acute pancreatitis without necrosis or infection, unspecified: Secondary | ICD-10-CM | POA: Diagnosis not present

## 2017-12-06 DIAGNOSIS — M545 Low back pain: Secondary | ICD-10-CM | POA: Diagnosis not present

## 2017-12-06 DIAGNOSIS — Z4789 Encounter for other orthopedic aftercare: Secondary | ICD-10-CM | POA: Diagnosis not present

## 2017-12-06 DIAGNOSIS — G8929 Other chronic pain: Secondary | ICD-10-CM | POA: Diagnosis not present

## 2017-12-06 DIAGNOSIS — S81001A Unspecified open wound, right knee, initial encounter: Secondary | ICD-10-CM | POA: Diagnosis not present

## 2017-12-06 DIAGNOSIS — N400 Enlarged prostate without lower urinary tract symptoms: Secondary | ICD-10-CM | POA: Diagnosis not present

## 2017-12-07 DIAGNOSIS — S81001D Unspecified open wound, right knee, subsequent encounter: Secondary | ICD-10-CM | POA: Diagnosis not present

## 2017-12-08 DIAGNOSIS — K859 Acute pancreatitis without necrosis or infection, unspecified: Secondary | ICD-10-CM | POA: Diagnosis not present

## 2017-12-08 DIAGNOSIS — G8929 Other chronic pain: Secondary | ICD-10-CM | POA: Diagnosis not present

## 2017-12-08 DIAGNOSIS — N400 Enlarged prostate without lower urinary tract symptoms: Secondary | ICD-10-CM | POA: Diagnosis not present

## 2017-12-08 DIAGNOSIS — M545 Low back pain: Secondary | ICD-10-CM | POA: Diagnosis not present

## 2017-12-08 DIAGNOSIS — S81001A Unspecified open wound, right knee, initial encounter: Secondary | ICD-10-CM | POA: Diagnosis not present

## 2017-12-08 DIAGNOSIS — Z4789 Encounter for other orthopedic aftercare: Secondary | ICD-10-CM | POA: Diagnosis not present

## 2018-01-06 DIAGNOSIS — S81001D Unspecified open wound, right knee, subsequent encounter: Secondary | ICD-10-CM | POA: Diagnosis not present

## 2018-02-04 DIAGNOSIS — Z1339 Encounter for screening examination for other mental health and behavioral disorders: Secondary | ICD-10-CM | POA: Diagnosis not present

## 2018-02-04 DIAGNOSIS — Z0001 Encounter for general adult medical examination with abnormal findings: Secondary | ICD-10-CM | POA: Diagnosis not present

## 2018-02-04 DIAGNOSIS — E669 Obesity, unspecified: Secondary | ICD-10-CM | POA: Diagnosis not present

## 2018-02-04 DIAGNOSIS — Z6839 Body mass index (BMI) 39.0-39.9, adult: Secondary | ICD-10-CM | POA: Diagnosis not present

## 2018-02-04 DIAGNOSIS — Z1331 Encounter for screening for depression: Secondary | ICD-10-CM | POA: Diagnosis not present

## 2018-04-12 DIAGNOSIS — R7301 Impaired fasting glucose: Secondary | ICD-10-CM | POA: Diagnosis not present

## 2018-04-12 DIAGNOSIS — I872 Venous insufficiency (chronic) (peripheral): Secondary | ICD-10-CM | POA: Diagnosis not present

## 2018-04-12 DIAGNOSIS — M2550 Pain in unspecified joint: Secondary | ICD-10-CM | POA: Diagnosis not present

## 2018-04-12 DIAGNOSIS — Z6841 Body Mass Index (BMI) 40.0 and over, adult: Secondary | ICD-10-CM | POA: Diagnosis not present

## 2018-04-12 DIAGNOSIS — E039 Hypothyroidism, unspecified: Secondary | ICD-10-CM | POA: Diagnosis not present

## 2018-04-12 DIAGNOSIS — Z8546 Personal history of malignant neoplasm of prostate: Secondary | ICD-10-CM | POA: Diagnosis not present

## 2018-04-12 DIAGNOSIS — K118 Other diseases of salivary glands: Secondary | ICD-10-CM | POA: Diagnosis not present

## 2018-04-22 DIAGNOSIS — K118 Other diseases of salivary glands: Secondary | ICD-10-CM | POA: Diagnosis not present

## 2018-04-28 DIAGNOSIS — R7989 Other specified abnormal findings of blood chemistry: Secondary | ICD-10-CM | POA: Diagnosis not present

## 2018-04-28 DIAGNOSIS — R7301 Impaired fasting glucose: Secondary | ICD-10-CM | POA: Diagnosis not present

## 2018-04-28 DIAGNOSIS — M2550 Pain in unspecified joint: Secondary | ICD-10-CM | POA: Diagnosis not present

## 2018-05-19 DIAGNOSIS — E669 Obesity, unspecified: Secondary | ICD-10-CM | POA: Diagnosis not present

## 2018-05-19 DIAGNOSIS — J3489 Other specified disorders of nose and nasal sinuses: Secondary | ICD-10-CM | POA: Diagnosis not present

## 2018-05-19 DIAGNOSIS — R221 Localized swelling, mass and lump, neck: Secondary | ICD-10-CM | POA: Diagnosis not present

## 2018-05-19 DIAGNOSIS — R590 Localized enlarged lymph nodes: Secondary | ICD-10-CM | POA: Diagnosis not present

## 2018-05-19 DIAGNOSIS — J342 Deviated nasal septum: Secondary | ICD-10-CM | POA: Diagnosis not present

## 2018-05-19 DIAGNOSIS — R269 Unspecified abnormalities of gait and mobility: Secondary | ICD-10-CM | POA: Diagnosis not present

## 2018-05-19 DIAGNOSIS — K118 Other diseases of salivary glands: Secondary | ICD-10-CM | POA: Diagnosis not present

## 2018-05-19 DIAGNOSIS — L711 Rhinophyma: Secondary | ICD-10-CM | POA: Diagnosis not present

## 2018-08-17 DIAGNOSIS — M7989 Other specified soft tissue disorders: Secondary | ICD-10-CM | POA: Diagnosis not present

## 2018-08-17 DIAGNOSIS — E785 Hyperlipidemia, unspecified: Secondary | ICD-10-CM | POA: Diagnosis not present

## 2018-08-17 DIAGNOSIS — E039 Hypothyroidism, unspecified: Secondary | ICD-10-CM | POA: Diagnosis not present

## 2018-08-17 DIAGNOSIS — R7301 Impaired fasting glucose: Secondary | ICD-10-CM | POA: Diagnosis not present

## 2018-08-17 DIAGNOSIS — M25552 Pain in left hip: Secondary | ICD-10-CM | POA: Diagnosis not present

## 2018-08-17 DIAGNOSIS — Z8546 Personal history of malignant neoplasm of prostate: Secondary | ICD-10-CM | POA: Diagnosis not present

## 2018-08-17 DIAGNOSIS — F419 Anxiety disorder, unspecified: Secondary | ICD-10-CM | POA: Diagnosis not present

## 2018-08-17 DIAGNOSIS — Z6841 Body Mass Index (BMI) 40.0 and over, adult: Secondary | ICD-10-CM | POA: Diagnosis not present

## 2018-08-17 DIAGNOSIS — M549 Dorsalgia, unspecified: Secondary | ICD-10-CM | POA: Diagnosis not present

## 2018-09-23 DIAGNOSIS — C61 Malignant neoplasm of prostate: Secondary | ICD-10-CM | POA: Diagnosis not present

## 2018-09-23 DIAGNOSIS — R972 Elevated prostate specific antigen [PSA]: Secondary | ICD-10-CM | POA: Diagnosis not present

## 2018-09-23 DIAGNOSIS — R310 Gross hematuria: Secondary | ICD-10-CM | POA: Diagnosis not present

## 2018-12-09 ENCOUNTER — Other Ambulatory Visit: Payer: Self-pay

## 2019-02-01 DIAGNOSIS — E039 Hypothyroidism, unspecified: Secondary | ICD-10-CM | POA: Diagnosis not present

## 2019-02-01 DIAGNOSIS — Z8546 Personal history of malignant neoplasm of prostate: Secondary | ICD-10-CM | POA: Diagnosis not present

## 2019-02-01 DIAGNOSIS — Z79899 Other long term (current) drug therapy: Secondary | ICD-10-CM | POA: Diagnosis not present

## 2019-02-01 DIAGNOSIS — E785 Hyperlipidemia, unspecified: Secondary | ICD-10-CM | POA: Diagnosis not present

## 2019-02-17 ENCOUNTER — Other Ambulatory Visit: Payer: Self-pay

## 2019-07-11 DIAGNOSIS — Z8546 Personal history of malignant neoplasm of prostate: Secondary | ICD-10-CM | POA: Diagnosis not present

## 2019-07-11 DIAGNOSIS — Z79899 Other long term (current) drug therapy: Secondary | ICD-10-CM | POA: Diagnosis not present

## 2019-07-11 DIAGNOSIS — E039 Hypothyroidism, unspecified: Secondary | ICD-10-CM | POA: Diagnosis not present

## 2019-07-11 DIAGNOSIS — E785 Hyperlipidemia, unspecified: Secondary | ICD-10-CM | POA: Diagnosis not present

## 2019-07-18 DIAGNOSIS — F410 Panic disorder [episodic paroxysmal anxiety] without agoraphobia: Secondary | ICD-10-CM | POA: Diagnosis not present

## 2019-07-18 DIAGNOSIS — E039 Hypothyroidism, unspecified: Secondary | ICD-10-CM | POA: Diagnosis not present

## 2019-07-18 DIAGNOSIS — M2550 Pain in unspecified joint: Secondary | ICD-10-CM | POA: Diagnosis not present

## 2019-07-18 DIAGNOSIS — I872 Venous insufficiency (chronic) (peripheral): Secondary | ICD-10-CM | POA: Diagnosis not present

## 2019-07-18 DIAGNOSIS — E785 Hyperlipidemia, unspecified: Secondary | ICD-10-CM | POA: Diagnosis not present

## 2019-07-18 DIAGNOSIS — Z6841 Body Mass Index (BMI) 40.0 and over, adult: Secondary | ICD-10-CM | POA: Diagnosis not present

## 2019-07-25 DIAGNOSIS — Z23 Encounter for immunization: Secondary | ICD-10-CM | POA: Diagnosis not present

## 2019-08-22 DIAGNOSIS — Z23 Encounter for immunization: Secondary | ICD-10-CM | POA: Diagnosis not present

## 2019-10-08 IMAGING — DX DG CHEST 1V
2 series · 2 of 2 positions shown · non-contrast
Comparison: 04/13/2015

CLINICAL DATA: Shortness of breath. Inability to urinate. Recent
knee surgery.

EXAM:
CHEST  1 VIEW

[chest ap (1 of 2)]
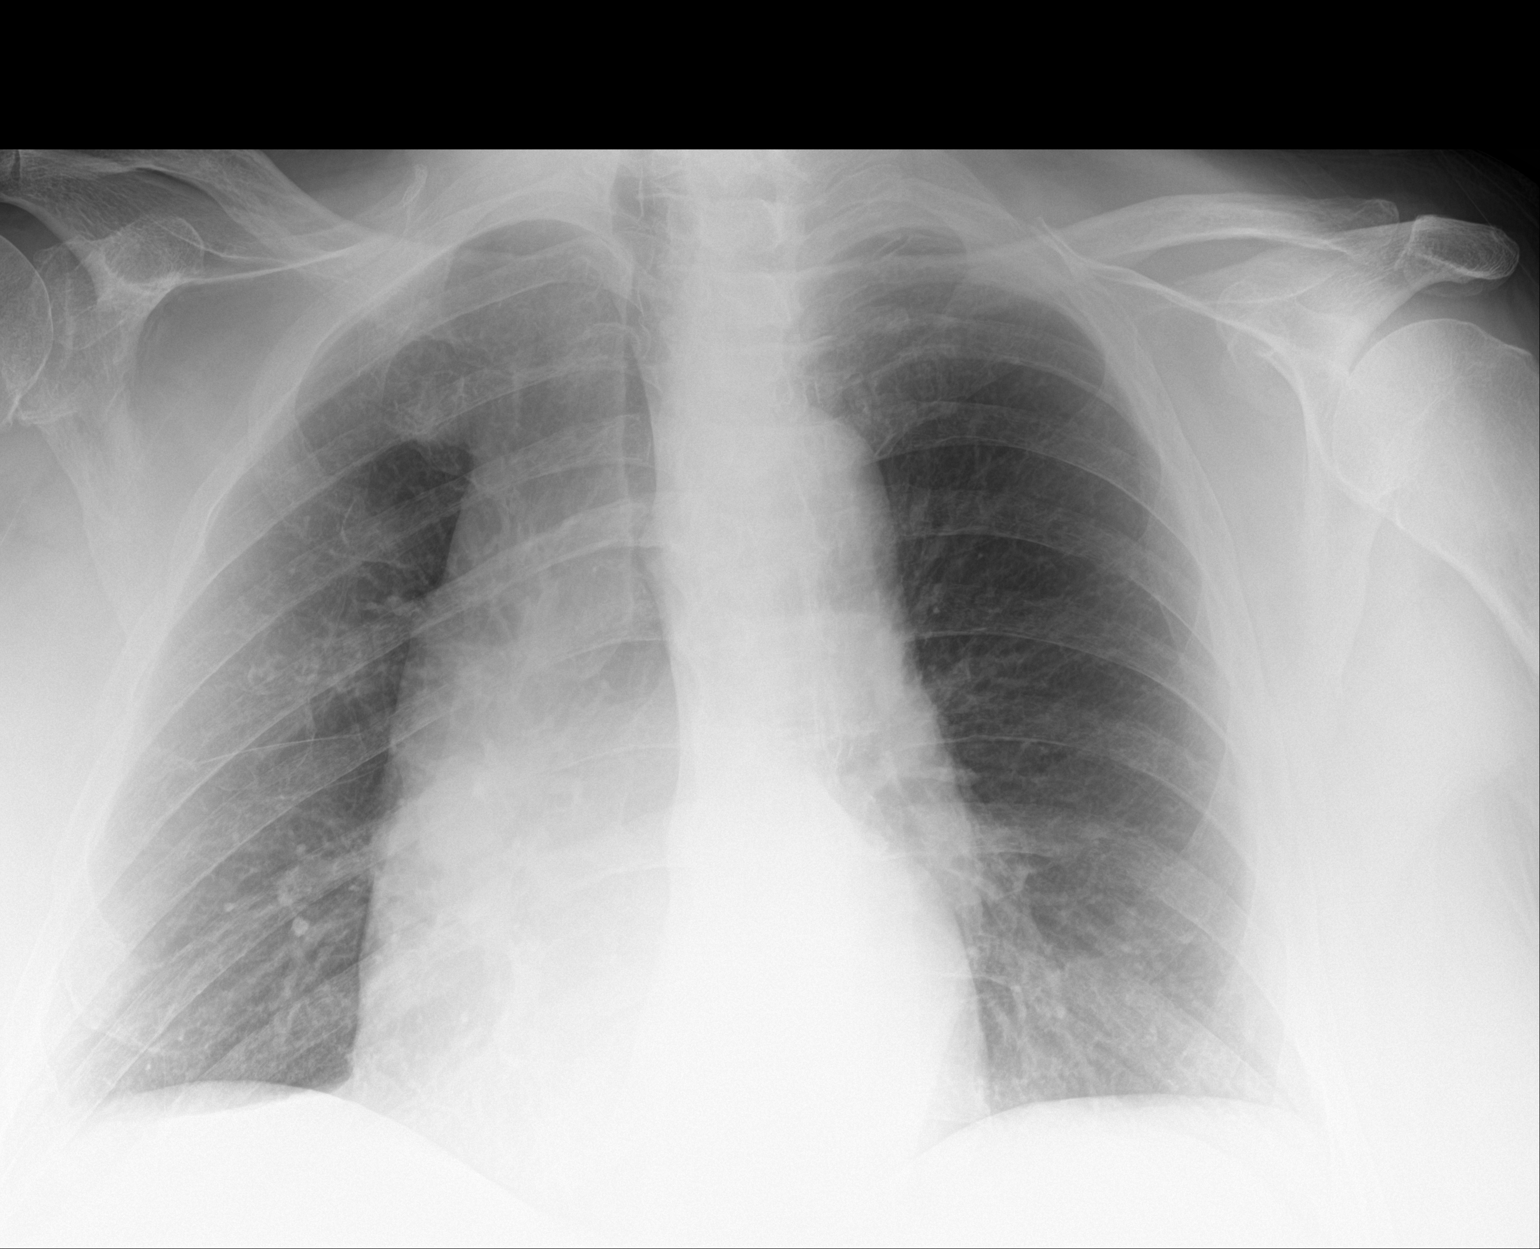

[chest ap (2 of 2)]
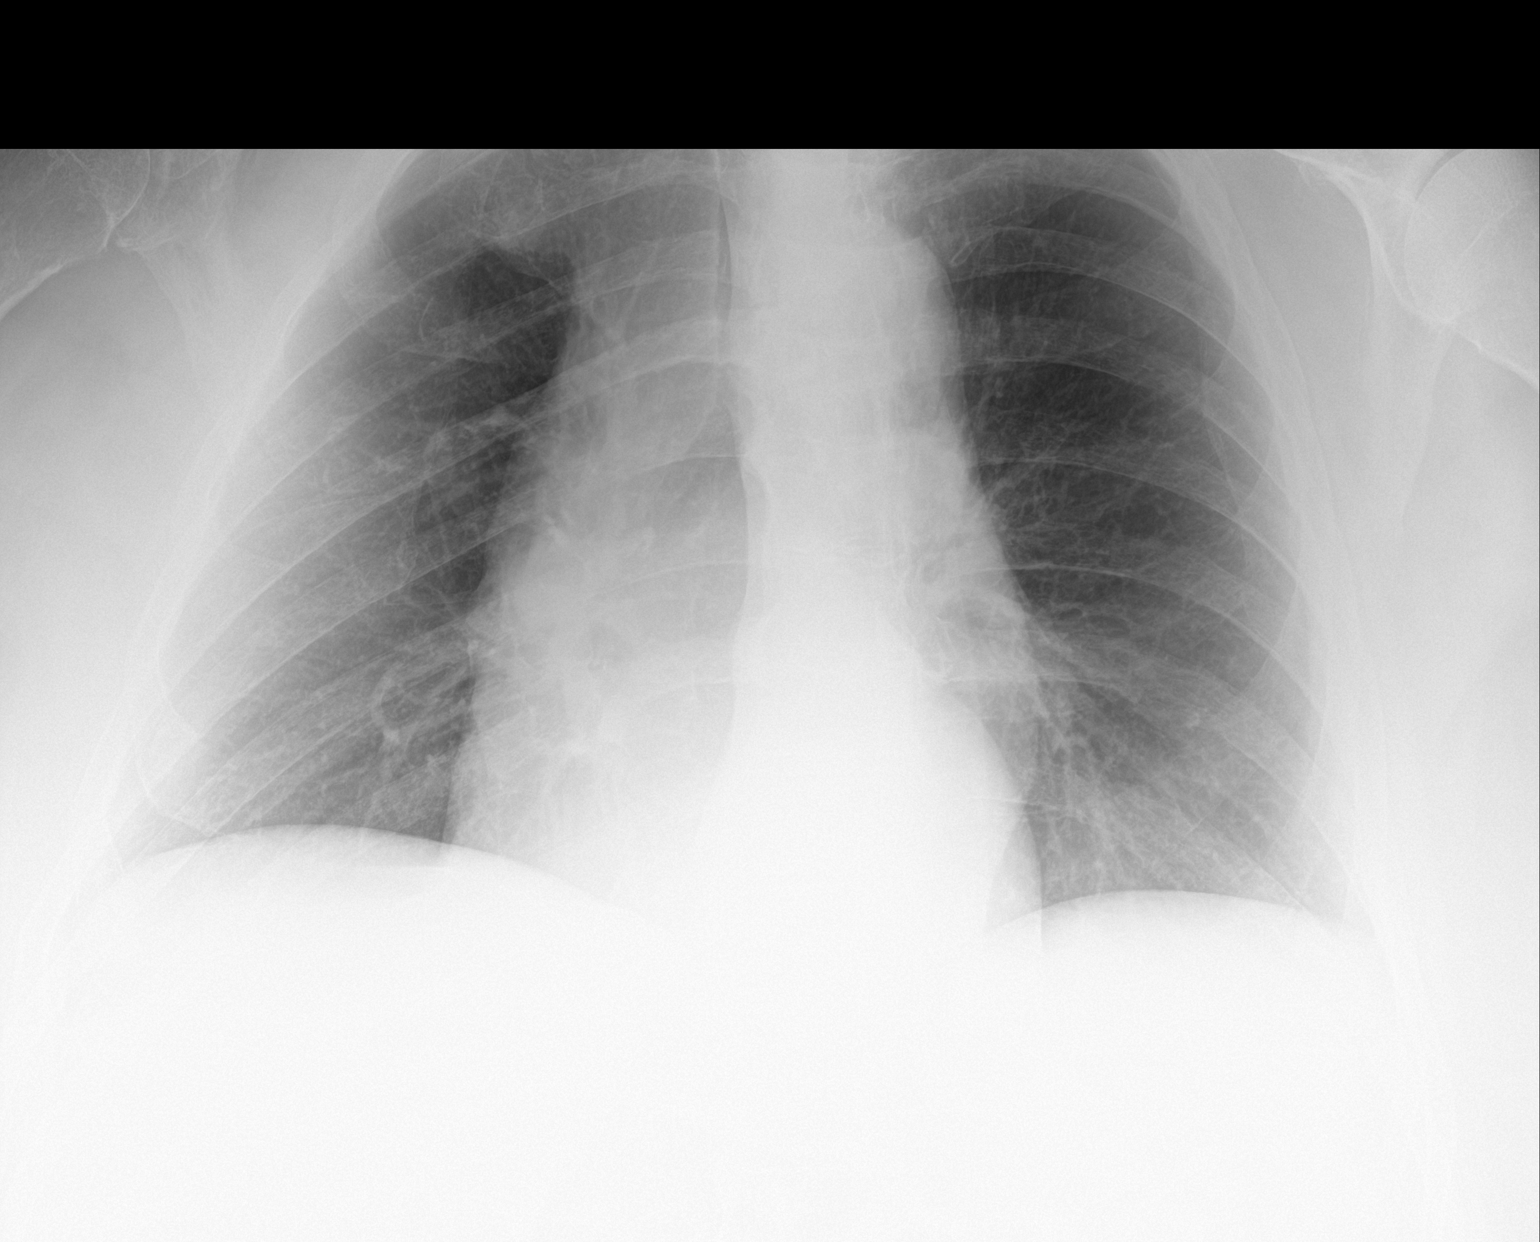

[2 of 2 positions shown; findings below may reference images not displayed]

FINDINGS: Two rotated AP portable radiographs of the chest. Midline trachea.
Apparent right paratracheal soft tissue fullness is felt to be
similar, given differences in obliquity. Tortuous thoracic aorta.
Mild cardiomegaly. No pleural effusion or pneumothorax.
Hyperinflation. No lobar consolidation. No congestive failure.
IMPRESSION: Mild hyperinflation and cardiomegaly, without acute disease.

Two rotated frontal radiographs. Apparent right paratracheal soft
tissue fullness is likely due to obliquity. When patient is
clinically stable, consider PA and lateral radiographs with
attention to this area.

## 2019-11-23 DIAGNOSIS — H40003 Preglaucoma, unspecified, bilateral: Secondary | ICD-10-CM | POA: Diagnosis not present

## 2020-01-25 DIAGNOSIS — E785 Hyperlipidemia, unspecified: Secondary | ICD-10-CM | POA: Diagnosis not present

## 2020-01-25 DIAGNOSIS — Z8546 Personal history of malignant neoplasm of prostate: Secondary | ICD-10-CM | POA: Diagnosis not present

## 2020-01-25 DIAGNOSIS — E039 Hypothyroidism, unspecified: Secondary | ICD-10-CM | POA: Diagnosis not present

## 2020-01-25 DIAGNOSIS — Z79899 Other long term (current) drug therapy: Secondary | ICD-10-CM | POA: Diagnosis not present

## 2020-02-05 DIAGNOSIS — I872 Venous insufficiency (chronic) (peripheral): Secondary | ICD-10-CM | POA: Diagnosis not present

## 2020-02-05 DIAGNOSIS — M2550 Pain in unspecified joint: Secondary | ICD-10-CM | POA: Diagnosis not present

## 2020-02-05 DIAGNOSIS — F410 Panic disorder [episodic paroxysmal anxiety] without agoraphobia: Secondary | ICD-10-CM | POA: Diagnosis not present

## 2020-02-05 DIAGNOSIS — E039 Hypothyroidism, unspecified: Secondary | ICD-10-CM | POA: Diagnosis not present

## 2020-02-26 DIAGNOSIS — R972 Elevated prostate specific antigen [PSA]: Secondary | ICD-10-CM | POA: Diagnosis not present

## 2020-02-26 DIAGNOSIS — C61 Malignant neoplasm of prostate: Secondary | ICD-10-CM | POA: Diagnosis not present

## 2020-04-01 DIAGNOSIS — Z23 Encounter for immunization: Secondary | ICD-10-CM | POA: Diagnosis not present

## 2020-07-29 DIAGNOSIS — Z8546 Personal history of malignant neoplasm of prostate: Secondary | ICD-10-CM | POA: Diagnosis not present

## 2020-07-29 DIAGNOSIS — E785 Hyperlipidemia, unspecified: Secondary | ICD-10-CM | POA: Diagnosis not present

## 2020-07-29 DIAGNOSIS — E039 Hypothyroidism, unspecified: Secondary | ICD-10-CM | POA: Diagnosis not present

## 2020-07-29 DIAGNOSIS — R7303 Prediabetes: Secondary | ICD-10-CM | POA: Diagnosis not present

## 2020-07-29 DIAGNOSIS — Z79899 Other long term (current) drug therapy: Secondary | ICD-10-CM | POA: Diagnosis not present

## 2020-08-27 DIAGNOSIS — C61 Malignant neoplasm of prostate: Secondary | ICD-10-CM | POA: Diagnosis not present

## 2020-08-27 DIAGNOSIS — R972 Elevated prostate specific antigen [PSA]: Secondary | ICD-10-CM | POA: Diagnosis not present

## 2021-03-23 DIAGNOSIS — R531 Weakness: Secondary | ICD-10-CM | POA: Diagnosis present

## 2021-03-23 DIAGNOSIS — M1990 Unspecified osteoarthritis, unspecified site: Secondary | ICD-10-CM | POA: Diagnosis present

## 2021-03-23 DIAGNOSIS — K5909 Other constipation: Secondary | ICD-10-CM | POA: Diagnosis present

## 2021-03-23 DIAGNOSIS — E039 Hypothyroidism, unspecified: Secondary | ICD-10-CM | POA: Diagnosis present

## 2021-03-23 DIAGNOSIS — I878 Other specified disorders of veins: Secondary | ICD-10-CM | POA: Diagnosis present

## 2021-03-23 DIAGNOSIS — S91209A Unspecified open wound of unspecified toe(s) with damage to nail, initial encounter: Secondary | ICD-10-CM | POA: Diagnosis not present

## 2021-03-23 DIAGNOSIS — Z792 Long term (current) use of antibiotics: Secondary | ICD-10-CM | POA: Diagnosis not present

## 2021-03-23 DIAGNOSIS — G629 Polyneuropathy, unspecified: Secondary | ICD-10-CM | POA: Diagnosis present

## 2021-03-23 DIAGNOSIS — I129 Hypertensive chronic kidney disease with stage 1 through stage 4 chronic kidney disease, or unspecified chronic kidney disease: Secondary | ICD-10-CM | POA: Diagnosis not present

## 2021-03-23 DIAGNOSIS — J449 Chronic obstructive pulmonary disease, unspecified: Secondary | ICD-10-CM | POA: Diagnosis present

## 2021-03-23 DIAGNOSIS — M7731 Calcaneal spur, right foot: Secondary | ICD-10-CM | POA: Diagnosis not present

## 2021-03-23 DIAGNOSIS — S91104A Unspecified open wound of right lesser toe(s) without damage to nail, initial encounter: Secondary | ICD-10-CM | POA: Diagnosis not present

## 2021-03-23 DIAGNOSIS — N183 Chronic kidney disease, stage 3 unspecified: Secondary | ICD-10-CM | POA: Diagnosis not present

## 2021-03-23 DIAGNOSIS — G8929 Other chronic pain: Secondary | ICD-10-CM | POA: Diagnosis not present

## 2021-03-23 DIAGNOSIS — L03115 Cellulitis of right lower limb: Secondary | ICD-10-CM | POA: Diagnosis not present

## 2021-03-23 DIAGNOSIS — R059 Cough, unspecified: Secondary | ICD-10-CM | POA: Diagnosis not present

## 2021-03-23 DIAGNOSIS — Z8546 Personal history of malignant neoplasm of prostate: Secondary | ICD-10-CM | POA: Diagnosis not present

## 2021-03-23 DIAGNOSIS — M79661 Pain in right lower leg: Secondary | ICD-10-CM | POA: Diagnosis not present

## 2021-03-23 DIAGNOSIS — Z887 Allergy status to serum and vaccine status: Secondary | ICD-10-CM | POA: Diagnosis not present

## 2021-03-23 DIAGNOSIS — Z6838 Body mass index (BMI) 38.0-38.9, adult: Secondary | ICD-10-CM | POA: Diagnosis not present

## 2021-03-23 DIAGNOSIS — Z888 Allergy status to other drugs, medicaments and biological substances status: Secondary | ICD-10-CM | POA: Diagnosis not present

## 2021-03-23 DIAGNOSIS — M79604 Pain in right leg: Secondary | ICD-10-CM | POA: Diagnosis not present

## 2021-03-23 DIAGNOSIS — Z87891 Personal history of nicotine dependence: Secondary | ICD-10-CM | POA: Diagnosis not present

## 2021-03-23 DIAGNOSIS — M7989 Other specified soft tissue disorders: Secondary | ICD-10-CM | POA: Diagnosis not present

## 2021-03-27 DIAGNOSIS — I129 Hypertensive chronic kidney disease with stage 1 through stage 4 chronic kidney disease, or unspecified chronic kidney disease: Secondary | ICD-10-CM | POA: Diagnosis not present

## 2021-03-27 DIAGNOSIS — I872 Venous insufficiency (chronic) (peripheral): Secondary | ICD-10-CM | POA: Diagnosis not present

## 2021-03-27 DIAGNOSIS — Z8546 Personal history of malignant neoplasm of prostate: Secondary | ICD-10-CM | POA: Diagnosis not present

## 2021-03-27 DIAGNOSIS — M1990 Unspecified osteoarthritis, unspecified site: Secondary | ICD-10-CM | POA: Diagnosis not present

## 2021-03-27 DIAGNOSIS — N189 Chronic kidney disease, unspecified: Secondary | ICD-10-CM | POA: Diagnosis not present

## 2021-03-27 DIAGNOSIS — G629 Polyneuropathy, unspecified: Secondary | ICD-10-CM | POA: Diagnosis not present

## 2021-03-27 DIAGNOSIS — Z87891 Personal history of nicotine dependence: Secondary | ICD-10-CM | POA: Diagnosis not present

## 2021-03-27 DIAGNOSIS — K5909 Other constipation: Secondary | ICD-10-CM | POA: Diagnosis not present

## 2021-03-27 DIAGNOSIS — L03115 Cellulitis of right lower limb: Secondary | ICD-10-CM | POA: Diagnosis not present

## 2021-03-27 DIAGNOSIS — E039 Hypothyroidism, unspecified: Secondary | ICD-10-CM | POA: Diagnosis not present

## 2021-03-27 DIAGNOSIS — Z7982 Long term (current) use of aspirin: Secondary | ICD-10-CM | POA: Diagnosis not present

## 2021-03-27 DIAGNOSIS — G8929 Other chronic pain: Secondary | ICD-10-CM | POA: Diagnosis not present

## 2021-03-28 DIAGNOSIS — K5909 Other constipation: Secondary | ICD-10-CM | POA: Diagnosis not present

## 2021-03-28 DIAGNOSIS — E039 Hypothyroidism, unspecified: Secondary | ICD-10-CM | POA: Diagnosis not present

## 2021-03-28 DIAGNOSIS — I129 Hypertensive chronic kidney disease with stage 1 through stage 4 chronic kidney disease, or unspecified chronic kidney disease: Secondary | ICD-10-CM | POA: Diagnosis not present

## 2021-03-28 DIAGNOSIS — L03115 Cellulitis of right lower limb: Secondary | ICD-10-CM | POA: Diagnosis not present

## 2021-03-28 DIAGNOSIS — M1990 Unspecified osteoarthritis, unspecified site: Secondary | ICD-10-CM | POA: Diagnosis not present

## 2021-03-28 DIAGNOSIS — N189 Chronic kidney disease, unspecified: Secondary | ICD-10-CM | POA: Diagnosis not present

## 2021-03-31 ENCOUNTER — Other Ambulatory Visit: Payer: Self-pay | Admitting: *Deleted

## 2021-03-31 NOTE — Patient Outreach (Signed)
[facility]) Care Management  03/31/2021  Gregoire Bennis. 03/15/1933 [id number]   Referral Date: 11/18 Referral Source: Data Analysis Date of Admission: 11/16 Diagnosis: [place] Date of Discharge: 11/17 Facility: [facility]: [facility]  Outreach attempt #1, successful.  Identity verified.  This care manager introduced self and stated purpose of call.  [facility] care management services explained.    Report he is doing well, active with home health team for nursing and PT.  Difference between home health and [facility] explained as well as benefits of [facility].  He declines offer to participate but does agree to receiving brochure about program.  Encouraged to consider and call this care manager with questions.  Plan: RN CM will send successful outreach letter with this care manager's contact information but will not open case.    Clinton Shea, [place], MSN [place] [phone]

## 2021-04-01 DIAGNOSIS — L03115 Cellulitis of right lower limb: Secondary | ICD-10-CM | POA: Diagnosis not present

## 2021-04-01 DIAGNOSIS — E039 Hypothyroidism, unspecified: Secondary | ICD-10-CM | POA: Diagnosis not present

## 2021-04-01 DIAGNOSIS — N189 Chronic kidney disease, unspecified: Secondary | ICD-10-CM | POA: Diagnosis not present

## 2021-04-01 DIAGNOSIS — M1990 Unspecified osteoarthritis, unspecified site: Secondary | ICD-10-CM | POA: Diagnosis not present

## 2021-04-01 DIAGNOSIS — K5909 Other constipation: Secondary | ICD-10-CM | POA: Diagnosis not present

## 2021-04-01 DIAGNOSIS — I129 Hypertensive chronic kidney disease with stage 1 through stage 4 chronic kidney disease, or unspecified chronic kidney disease: Secondary | ICD-10-CM | POA: Diagnosis not present

## 2021-04-02 DIAGNOSIS — Z09 Encounter for follow-up examination after completed treatment for conditions other than malignant neoplasm: Secondary | ICD-10-CM | POA: Diagnosis not present

## 2021-04-02 DIAGNOSIS — L03115 Cellulitis of right lower limb: Secondary | ICD-10-CM | POA: Diagnosis not present

## 2021-04-02 DIAGNOSIS — Z6841 Body Mass Index (BMI) 40.0 and over, adult: Secondary | ICD-10-CM | POA: Diagnosis not present

## 2021-04-04 DIAGNOSIS — K5909 Other constipation: Secondary | ICD-10-CM | POA: Diagnosis not present

## 2021-04-04 DIAGNOSIS — M1990 Unspecified osteoarthritis, unspecified site: Secondary | ICD-10-CM | POA: Diagnosis not present

## 2021-04-04 DIAGNOSIS — E039 Hypothyroidism, unspecified: Secondary | ICD-10-CM | POA: Diagnosis not present

## 2021-04-04 DIAGNOSIS — L03115 Cellulitis of right lower limb: Secondary | ICD-10-CM | POA: Diagnosis not present

## 2021-04-04 DIAGNOSIS — N189 Chronic kidney disease, unspecified: Secondary | ICD-10-CM | POA: Diagnosis not present

## 2021-04-04 DIAGNOSIS — I129 Hypertensive chronic kidney disease with stage 1 through stage 4 chronic kidney disease, or unspecified chronic kidney disease: Secondary | ICD-10-CM | POA: Diagnosis not present

## 2021-04-07 DIAGNOSIS — I129 Hypertensive chronic kidney disease with stage 1 through stage 4 chronic kidney disease, or unspecified chronic kidney disease: Secondary | ICD-10-CM | POA: Diagnosis not present

## 2021-04-07 DIAGNOSIS — E039 Hypothyroidism, unspecified: Secondary | ICD-10-CM | POA: Diagnosis not present

## 2021-04-07 DIAGNOSIS — L03115 Cellulitis of right lower limb: Secondary | ICD-10-CM | POA: Diagnosis not present

## 2021-04-07 DIAGNOSIS — N189 Chronic kidney disease, unspecified: Secondary | ICD-10-CM | POA: Diagnosis not present

## 2021-04-07 DIAGNOSIS — K5909 Other constipation: Secondary | ICD-10-CM | POA: Diagnosis not present

## 2021-04-07 DIAGNOSIS — M1990 Unspecified osteoarthritis, unspecified site: Secondary | ICD-10-CM | POA: Diagnosis not present

## 2021-04-10 ENCOUNTER — Encounter: Payer: Self-pay | Admitting: Podiatry

## 2021-04-10 ENCOUNTER — Ambulatory Visit (INDEPENDENT_AMBULATORY_CARE_PROVIDER_SITE_OTHER): Payer: Medicare Other | Admitting: Podiatry

## 2021-04-10 DIAGNOSIS — L601 Onycholysis: Secondary | ICD-10-CM

## 2021-04-10 DIAGNOSIS — R600 Localized edema: Secondary | ICD-10-CM

## 2021-04-10 DIAGNOSIS — I872 Venous insufficiency (chronic) (peripheral): Secondary | ICD-10-CM | POA: Diagnosis not present

## 2021-04-10 DIAGNOSIS — E1142 Type 2 diabetes mellitus with diabetic polyneuropathy: Secondary | ICD-10-CM

## 2021-04-10 DIAGNOSIS — E039 Hypothyroidism, unspecified: Secondary | ICD-10-CM | POA: Insufficient documentation

## 2021-04-10 DIAGNOSIS — E1169 Type 2 diabetes mellitus with other specified complication: Secondary | ICD-10-CM

## 2021-04-10 DIAGNOSIS — L03115 Cellulitis of right lower limb: Secondary | ICD-10-CM

## 2021-04-10 DIAGNOSIS — B351 Tinea unguium: Secondary | ICD-10-CM

## 2021-04-10 DIAGNOSIS — E079 Disorder of thyroid, unspecified: Secondary | ICD-10-CM | POA: Insufficient documentation

## 2021-04-10 MED ORDER — CLINDAMYCIN HCL 300 MG PO CAPS: 300.0000 mg | ORAL_CAPSULE | Freq: Two times a day (BID) | ORAL | 0 refills | Status: DC

## 2021-04-10 NOTE — Progress Notes (Signed)
  Subjective:  Patient ID: [name]., male    DOB: Apr 21, 1933,  MRN: [id number]  Chief Complaint  Patient presents with   Nail Problem    Trim nails and we saw Dr March Rummage in the hospital and the right big toenail was removed by him and was told by the nurse that ya'll would check the right leg    87 y.o. male presents with the above complaint. History confirmed with patient.   Objective:  Physical Exam: warm, good capillary refill, normal DP and PT pulses, and absent protective sensation.  Pitting edema bilateral lower extremity Right LE: Right great toenail site healing well. Mild edema of leg with small area of warmth and erythema   Assessment:   1. Onycholysis   2. Onychomycosis of multiple toenails with type 2 diabetes mellitus and peripheral neuropathy ([place])   3. Localized edema   4. Venous insufficiency of right leg   5. Cellulitis of leg, right    Plan:  Patient was evaluated and treated and all questions answered.  History of traumatic lysis toenail right great toe -Healing well without issue.  Continue to dress daily with antibiotic ointment and Band-Aid  Cellulitis, edema right lower extremity -Rx clindamycin -Unna boot applied right   Onychomycosis -Patient is diabetic with a qualifying condition for at risk foot care.  Procedure: Nail Debridement Type of Debridement: manual, sharp debridement. Instrumentation: Nail nipper, rotary burr. Number of Nails: 9   Return in about 3 weeks (around 05/01/2021).

## 2021-04-11 DIAGNOSIS — N189 Chronic kidney disease, unspecified: Secondary | ICD-10-CM | POA: Diagnosis not present

## 2021-04-11 DIAGNOSIS — I129 Hypertensive chronic kidney disease with stage 1 through stage 4 chronic kidney disease, or unspecified chronic kidney disease: Secondary | ICD-10-CM | POA: Diagnosis not present

## 2021-04-11 DIAGNOSIS — M1990 Unspecified osteoarthritis, unspecified site: Secondary | ICD-10-CM | POA: Diagnosis not present

## 2021-04-11 DIAGNOSIS — K5909 Other constipation: Secondary | ICD-10-CM | POA: Diagnosis not present

## 2021-04-11 DIAGNOSIS — E039 Hypothyroidism, unspecified: Secondary | ICD-10-CM | POA: Diagnosis not present

## 2021-04-11 DIAGNOSIS — L03115 Cellulitis of right lower limb: Secondary | ICD-10-CM | POA: Diagnosis not present

## 2021-04-14 ENCOUNTER — Telehealth: Payer: Self-pay | Admitting: Podiatry

## 2021-04-14 DIAGNOSIS — M1990 Unspecified osteoarthritis, unspecified site: Secondary | ICD-10-CM | POA: Diagnosis not present

## 2021-04-14 DIAGNOSIS — N189 Chronic kidney disease, unspecified: Secondary | ICD-10-CM | POA: Diagnosis not present

## 2021-04-14 DIAGNOSIS — L03115 Cellulitis of right lower limb: Secondary | ICD-10-CM | POA: Diagnosis not present

## 2021-04-14 DIAGNOSIS — I129 Hypertensive chronic kidney disease with stage 1 through stage 4 chronic kidney disease, or unspecified chronic kidney disease: Secondary | ICD-10-CM | POA: Diagnosis not present

## 2021-04-14 DIAGNOSIS — E039 Hypothyroidism, unspecified: Secondary | ICD-10-CM | POA: Diagnosis not present

## 2021-04-14 DIAGNOSIS — K5909 Other constipation: Secondary | ICD-10-CM | POA: Diagnosis not present

## 2021-04-14 NOTE — Telephone Encounter (Signed)
Pt was given antibiotic last visit but he has had diarrhea sev x's with it, so pharmacists had him stop medication-did you want pt to start a different med.  He did take a probiotic yesterday that his nurse said he could take for stoamch and fluid pill Sat to try to help with swelling in leg Has nurse coming today to change bandage.  [facility] [address]

## 2021-04-15 NOTE — Telephone Encounter (Signed)
Pt wanted to let you know diarrhea has stopped completely, he is taking probiotic.  Wound is looking good. [place] nurse changed bandage yesterday and his vital were good.

## 2021-04-15 NOTE — Telephone Encounter (Signed)
Pt notified/reb °

## 2021-04-15 NOTE — Telephone Encounter (Signed)
Noted thanks please let him know he does not need another abx

## 2021-04-17 DIAGNOSIS — M1990 Unspecified osteoarthritis, unspecified site: Secondary | ICD-10-CM | POA: Diagnosis not present

## 2021-04-17 DIAGNOSIS — N189 Chronic kidney disease, unspecified: Secondary | ICD-10-CM | POA: Diagnosis not present

## 2021-04-17 DIAGNOSIS — K5909 Other constipation: Secondary | ICD-10-CM | POA: Diagnosis not present

## 2021-04-17 DIAGNOSIS — L03115 Cellulitis of right lower limb: Secondary | ICD-10-CM | POA: Diagnosis not present

## 2021-04-17 DIAGNOSIS — E039 Hypothyroidism, unspecified: Secondary | ICD-10-CM | POA: Diagnosis not present

## 2021-04-17 DIAGNOSIS — I129 Hypertensive chronic kidney disease with stage 1 through stage 4 chronic kidney disease, or unspecified chronic kidney disease: Secondary | ICD-10-CM | POA: Diagnosis not present

## 2021-04-18 DIAGNOSIS — I129 Hypertensive chronic kidney disease with stage 1 through stage 4 chronic kidney disease, or unspecified chronic kidney disease: Secondary | ICD-10-CM | POA: Diagnosis not present

## 2021-04-18 DIAGNOSIS — M1990 Unspecified osteoarthritis, unspecified site: Secondary | ICD-10-CM | POA: Diagnosis not present

## 2021-04-18 DIAGNOSIS — L03115 Cellulitis of right lower limb: Secondary | ICD-10-CM | POA: Diagnosis not present

## 2021-04-18 DIAGNOSIS — E039 Hypothyroidism, unspecified: Secondary | ICD-10-CM | POA: Diagnosis not present

## 2021-04-18 DIAGNOSIS — K5909 Other constipation: Secondary | ICD-10-CM | POA: Diagnosis not present

## 2021-04-18 DIAGNOSIS — N189 Chronic kidney disease, unspecified: Secondary | ICD-10-CM | POA: Diagnosis not present

## 2021-04-21 DIAGNOSIS — I129 Hypertensive chronic kidney disease with stage 1 through stage 4 chronic kidney disease, or unspecified chronic kidney disease: Secondary | ICD-10-CM | POA: Diagnosis not present

## 2021-04-21 DIAGNOSIS — N189 Chronic kidney disease, unspecified: Secondary | ICD-10-CM | POA: Diagnosis not present

## 2021-04-21 DIAGNOSIS — K5909 Other constipation: Secondary | ICD-10-CM | POA: Diagnosis not present

## 2021-04-21 DIAGNOSIS — M1990 Unspecified osteoarthritis, unspecified site: Secondary | ICD-10-CM | POA: Diagnosis not present

## 2021-04-21 DIAGNOSIS — L03115 Cellulitis of right lower limb: Secondary | ICD-10-CM | POA: Diagnosis not present

## 2021-04-21 DIAGNOSIS — E039 Hypothyroidism, unspecified: Secondary | ICD-10-CM | POA: Diagnosis not present

## 2021-04-24 DIAGNOSIS — K5909 Other constipation: Secondary | ICD-10-CM | POA: Diagnosis not present

## 2021-04-24 DIAGNOSIS — N189 Chronic kidney disease, unspecified: Secondary | ICD-10-CM | POA: Diagnosis not present

## 2021-04-24 DIAGNOSIS — M1990 Unspecified osteoarthritis, unspecified site: Secondary | ICD-10-CM | POA: Diagnosis not present

## 2021-04-24 DIAGNOSIS — E039 Hypothyroidism, unspecified: Secondary | ICD-10-CM | POA: Diagnosis not present

## 2021-04-24 DIAGNOSIS — L03115 Cellulitis of right lower limb: Secondary | ICD-10-CM | POA: Diagnosis not present

## 2021-04-24 DIAGNOSIS — I129 Hypertensive chronic kidney disease with stage 1 through stage 4 chronic kidney disease, or unspecified chronic kidney disease: Secondary | ICD-10-CM | POA: Diagnosis not present

## 2021-04-26 DIAGNOSIS — Z87891 Personal history of nicotine dependence: Secondary | ICD-10-CM | POA: Diagnosis not present

## 2021-04-26 DIAGNOSIS — L03115 Cellulitis of right lower limb: Secondary | ICD-10-CM | POA: Diagnosis not present

## 2021-04-26 DIAGNOSIS — G629 Polyneuropathy, unspecified: Secondary | ICD-10-CM | POA: Diagnosis not present

## 2021-04-26 DIAGNOSIS — I129 Hypertensive chronic kidney disease with stage 1 through stage 4 chronic kidney disease, or unspecified chronic kidney disease: Secondary | ICD-10-CM | POA: Diagnosis not present

## 2021-04-26 DIAGNOSIS — Z8546 Personal history of malignant neoplasm of prostate: Secondary | ICD-10-CM | POA: Diagnosis not present

## 2021-04-26 DIAGNOSIS — E039 Hypothyroidism, unspecified: Secondary | ICD-10-CM | POA: Diagnosis not present

## 2021-04-26 DIAGNOSIS — Z7982 Long term (current) use of aspirin: Secondary | ICD-10-CM | POA: Diagnosis not present

## 2021-04-26 DIAGNOSIS — G8929 Other chronic pain: Secondary | ICD-10-CM | POA: Diagnosis not present

## 2021-04-26 DIAGNOSIS — I872 Venous insufficiency (chronic) (peripheral): Secondary | ICD-10-CM | POA: Diagnosis not present

## 2021-04-26 DIAGNOSIS — M1990 Unspecified osteoarthritis, unspecified site: Secondary | ICD-10-CM | POA: Diagnosis not present

## 2021-04-26 DIAGNOSIS — K5909 Other constipation: Secondary | ICD-10-CM | POA: Diagnosis not present

## 2021-04-26 DIAGNOSIS — N189 Chronic kidney disease, unspecified: Secondary | ICD-10-CM | POA: Diagnosis not present

## 2021-04-28 DIAGNOSIS — E039 Hypothyroidism, unspecified: Secondary | ICD-10-CM | POA: Diagnosis not present

## 2021-04-28 DIAGNOSIS — L03115 Cellulitis of right lower limb: Secondary | ICD-10-CM | POA: Diagnosis not present

## 2021-04-28 DIAGNOSIS — N189 Chronic kidney disease, unspecified: Secondary | ICD-10-CM | POA: Diagnosis not present

## 2021-04-28 DIAGNOSIS — K5909 Other constipation: Secondary | ICD-10-CM | POA: Diagnosis not present

## 2021-04-28 DIAGNOSIS — M1990 Unspecified osteoarthritis, unspecified site: Secondary | ICD-10-CM | POA: Diagnosis not present

## 2021-04-28 DIAGNOSIS — I129 Hypertensive chronic kidney disease with stage 1 through stage 4 chronic kidney disease, or unspecified chronic kidney disease: Secondary | ICD-10-CM | POA: Diagnosis not present

## 2021-05-01 ENCOUNTER — Encounter: Payer: Self-pay | Admitting: Podiatry

## 2021-05-01 ENCOUNTER — Ambulatory Visit (INDEPENDENT_AMBULATORY_CARE_PROVIDER_SITE_OTHER): Payer: Medicare Other | Admitting: Podiatry

## 2021-05-01 DIAGNOSIS — L601 Onycholysis: Secondary | ICD-10-CM

## 2021-05-01 DIAGNOSIS — I872 Venous insufficiency (chronic) (peripheral): Secondary | ICD-10-CM | POA: Diagnosis not present

## 2021-05-01 DIAGNOSIS — R600 Localized edema: Secondary | ICD-10-CM

## 2021-05-02 DIAGNOSIS — L03115 Cellulitis of right lower limb: Secondary | ICD-10-CM | POA: Diagnosis not present

## 2021-05-02 DIAGNOSIS — M1990 Unspecified osteoarthritis, unspecified site: Secondary | ICD-10-CM | POA: Diagnosis not present

## 2021-05-02 DIAGNOSIS — I129 Hypertensive chronic kidney disease with stage 1 through stage 4 chronic kidney disease, or unspecified chronic kidney disease: Secondary | ICD-10-CM | POA: Diagnosis not present

## 2021-05-02 DIAGNOSIS — N189 Chronic kidney disease, unspecified: Secondary | ICD-10-CM | POA: Diagnosis not present

## 2021-05-02 DIAGNOSIS — K5909 Other constipation: Secondary | ICD-10-CM | POA: Diagnosis not present

## 2021-05-02 DIAGNOSIS — E039 Hypothyroidism, unspecified: Secondary | ICD-10-CM | POA: Diagnosis not present

## 2021-05-06 DIAGNOSIS — I129 Hypertensive chronic kidney disease with stage 1 through stage 4 chronic kidney disease, or unspecified chronic kidney disease: Secondary | ICD-10-CM | POA: Diagnosis not present

## 2021-05-06 DIAGNOSIS — M1990 Unspecified osteoarthritis, unspecified site: Secondary | ICD-10-CM | POA: Diagnosis not present

## 2021-05-06 DIAGNOSIS — K5909 Other constipation: Secondary | ICD-10-CM | POA: Diagnosis not present

## 2021-05-06 DIAGNOSIS — L03115 Cellulitis of right lower limb: Secondary | ICD-10-CM | POA: Diagnosis not present

## 2021-05-06 DIAGNOSIS — E039 Hypothyroidism, unspecified: Secondary | ICD-10-CM | POA: Diagnosis not present

## 2021-05-06 DIAGNOSIS — N189 Chronic kidney disease, unspecified: Secondary | ICD-10-CM | POA: Diagnosis not present

## 2021-05-08 DIAGNOSIS — I129 Hypertensive chronic kidney disease with stage 1 through stage 4 chronic kidney disease, or unspecified chronic kidney disease: Secondary | ICD-10-CM | POA: Diagnosis not present

## 2021-05-08 DIAGNOSIS — M1990 Unspecified osteoarthritis, unspecified site: Secondary | ICD-10-CM | POA: Diagnosis not present

## 2021-05-08 DIAGNOSIS — E039 Hypothyroidism, unspecified: Secondary | ICD-10-CM | POA: Diagnosis not present

## 2021-05-08 DIAGNOSIS — N189 Chronic kidney disease, unspecified: Secondary | ICD-10-CM | POA: Diagnosis not present

## 2021-05-08 DIAGNOSIS — L03115 Cellulitis of right lower limb: Secondary | ICD-10-CM | POA: Diagnosis not present

## 2021-05-08 DIAGNOSIS — K5909 Other constipation: Secondary | ICD-10-CM | POA: Diagnosis not present

## 2021-05-08 NOTE — Progress Notes (Signed)
°  Subjective:  Patient ID: Clinton Mail., male    DOB: [birth year],  MRN: [id number]  Chief Complaint  Patient presents with   Nail Problem    The right big toe is doing ok    Foot Pain    The right leg is better  but not much and still has some peeling on the bottom of the foot    87 y.o. male presents with the above complaint. History confirmed with patient.   Objective:  Physical Exam: warm, good capillary refill, normal DP and PT pulses, and absent protective sensation.  Pitting edema bilateral lower extremity Right LE: Right great toenail site healing well. Mild edema of leg without warmth and erythema   Assessment:   1. Venous insufficiency of right leg   2. Onycholysis   3. Localized edema    Plan:  Patient was evaluated and treated and all questions answered.  History of traumatic lysis toenail right great toe -Well healed. No need for further care.  Cellulitis, edema right lower extremity -Discussed importance of compression socks.    Return in about 2 months (around 07/02/2021) for Diabetic Foot Care.

## 2021-05-09 DIAGNOSIS — K5909 Other constipation: Secondary | ICD-10-CM | POA: Diagnosis not present

## 2021-05-09 DIAGNOSIS — I129 Hypertensive chronic kidney disease with stage 1 through stage 4 chronic kidney disease, or unspecified chronic kidney disease: Secondary | ICD-10-CM | POA: Diagnosis not present

## 2021-05-09 DIAGNOSIS — E039 Hypothyroidism, unspecified: Secondary | ICD-10-CM | POA: Diagnosis not present

## 2021-05-09 DIAGNOSIS — M1990 Unspecified osteoarthritis, unspecified site: Secondary | ICD-10-CM | POA: Diagnosis not present

## 2021-05-09 DIAGNOSIS — N189 Chronic kidney disease, unspecified: Secondary | ICD-10-CM | POA: Diagnosis not present

## 2021-05-09 DIAGNOSIS — L03115 Cellulitis of right lower limb: Secondary | ICD-10-CM | POA: Diagnosis not present

## 2021-05-13 DIAGNOSIS — E039 Hypothyroidism, unspecified: Secondary | ICD-10-CM | POA: Diagnosis not present

## 2021-05-13 DIAGNOSIS — N189 Chronic kidney disease, unspecified: Secondary | ICD-10-CM | POA: Diagnosis not present

## 2021-05-13 DIAGNOSIS — M1990 Unspecified osteoarthritis, unspecified site: Secondary | ICD-10-CM | POA: Diagnosis not present

## 2021-05-13 DIAGNOSIS — K5909 Other constipation: Secondary | ICD-10-CM | POA: Diagnosis not present

## 2021-05-13 DIAGNOSIS — I129 Hypertensive chronic kidney disease with stage 1 through stage 4 chronic kidney disease, or unspecified chronic kidney disease: Secondary | ICD-10-CM | POA: Diagnosis not present

## 2021-05-13 DIAGNOSIS — L03115 Cellulitis of right lower limb: Secondary | ICD-10-CM | POA: Diagnosis not present

## 2021-05-14 DIAGNOSIS — K5909 Other constipation: Secondary | ICD-10-CM | POA: Diagnosis not present

## 2021-05-14 DIAGNOSIS — L03115 Cellulitis of right lower limb: Secondary | ICD-10-CM | POA: Diagnosis not present

## 2021-05-14 DIAGNOSIS — I129 Hypertensive chronic kidney disease with stage 1 through stage 4 chronic kidney disease, or unspecified chronic kidney disease: Secondary | ICD-10-CM | POA: Diagnosis not present

## 2021-05-14 DIAGNOSIS — E039 Hypothyroidism, unspecified: Secondary | ICD-10-CM | POA: Diagnosis not present

## 2021-05-14 DIAGNOSIS — M1990 Unspecified osteoarthritis, unspecified site: Secondary | ICD-10-CM | POA: Diagnosis not present

## 2021-05-14 DIAGNOSIS — N189 Chronic kidney disease, unspecified: Secondary | ICD-10-CM | POA: Diagnosis not present

## 2021-05-16 DIAGNOSIS — E039 Hypothyroidism, unspecified: Secondary | ICD-10-CM | POA: Diagnosis not present

## 2021-05-16 DIAGNOSIS — K5909 Other constipation: Secondary | ICD-10-CM | POA: Diagnosis not present

## 2021-05-16 DIAGNOSIS — L03115 Cellulitis of right lower limb: Secondary | ICD-10-CM | POA: Diagnosis not present

## 2021-05-16 DIAGNOSIS — I129 Hypertensive chronic kidney disease with stage 1 through stage 4 chronic kidney disease, or unspecified chronic kidney disease: Secondary | ICD-10-CM | POA: Diagnosis not present

## 2021-05-16 DIAGNOSIS — N189 Chronic kidney disease, unspecified: Secondary | ICD-10-CM | POA: Diagnosis not present

## 2021-05-16 DIAGNOSIS — M1990 Unspecified osteoarthritis, unspecified site: Secondary | ICD-10-CM | POA: Diagnosis not present

## 2021-05-19 DIAGNOSIS — I129 Hypertensive chronic kidney disease with stage 1 through stage 4 chronic kidney disease, or unspecified chronic kidney disease: Secondary | ICD-10-CM | POA: Diagnosis not present

## 2021-05-19 DIAGNOSIS — E039 Hypothyroidism, unspecified: Secondary | ICD-10-CM | POA: Diagnosis not present

## 2021-05-19 DIAGNOSIS — L03115 Cellulitis of right lower limb: Secondary | ICD-10-CM | POA: Diagnosis not present

## 2021-05-19 DIAGNOSIS — M1990 Unspecified osteoarthritis, unspecified site: Secondary | ICD-10-CM | POA: Diagnosis not present

## 2021-05-19 DIAGNOSIS — K5909 Other constipation: Secondary | ICD-10-CM | POA: Diagnosis not present

## 2021-05-19 DIAGNOSIS — N189 Chronic kidney disease, unspecified: Secondary | ICD-10-CM | POA: Diagnosis not present

## 2021-05-20 DIAGNOSIS — E538 Deficiency of other specified B group vitamins: Secondary | ICD-10-CM | POA: Diagnosis not present

## 2021-05-20 DIAGNOSIS — Z131 Encounter for screening for diabetes mellitus: Secondary | ICD-10-CM | POA: Diagnosis not present

## 2021-05-20 DIAGNOSIS — E039 Hypothyroidism, unspecified: Secondary | ICD-10-CM | POA: Diagnosis not present

## 2021-05-20 DIAGNOSIS — E785 Hyperlipidemia, unspecified: Secondary | ICD-10-CM | POA: Diagnosis not present

## 2021-05-20 DIAGNOSIS — Z125 Encounter for screening for malignant neoplasm of prostate: Secondary | ICD-10-CM | POA: Diagnosis not present

## 2021-05-20 DIAGNOSIS — Z79899 Other long term (current) drug therapy: Secondary | ICD-10-CM | POA: Diagnosis not present

## 2021-05-21 DIAGNOSIS — H401131 Primary open-angle glaucoma, bilateral, mild stage: Secondary | ICD-10-CM | POA: Diagnosis not present

## 2021-05-21 DIAGNOSIS — H25813 Combined forms of age-related cataract, bilateral: Secondary | ICD-10-CM | POA: Diagnosis not present

## 2021-05-22 DIAGNOSIS — I129 Hypertensive chronic kidney disease with stage 1 through stage 4 chronic kidney disease, or unspecified chronic kidney disease: Secondary | ICD-10-CM | POA: Diagnosis not present

## 2021-05-22 DIAGNOSIS — E039 Hypothyroidism, unspecified: Secondary | ICD-10-CM | POA: Diagnosis not present

## 2021-05-22 DIAGNOSIS — N189 Chronic kidney disease, unspecified: Secondary | ICD-10-CM | POA: Diagnosis not present

## 2021-05-22 DIAGNOSIS — K5909 Other constipation: Secondary | ICD-10-CM | POA: Diagnosis not present

## 2021-05-22 DIAGNOSIS — L03115 Cellulitis of right lower limb: Secondary | ICD-10-CM | POA: Diagnosis not present

## 2021-05-22 DIAGNOSIS — M1990 Unspecified osteoarthritis, unspecified site: Secondary | ICD-10-CM | POA: Diagnosis not present

## 2021-06-30 DIAGNOSIS — I872 Venous insufficiency (chronic) (peripheral): Secondary | ICD-10-CM | POA: Diagnosis not present

## 2021-06-30 DIAGNOSIS — R5381 Other malaise: Secondary | ICD-10-CM | POA: Diagnosis not present

## 2021-06-30 DIAGNOSIS — R220 Localized swelling, mass and lump, head: Secondary | ICD-10-CM | POA: Diagnosis not present

## 2021-06-30 DIAGNOSIS — R0602 Shortness of breath: Secondary | ICD-10-CM | POA: Diagnosis not present

## 2021-06-30 DIAGNOSIS — M2550 Pain in unspecified joint: Secondary | ICD-10-CM | POA: Diagnosis not present

## 2021-06-30 DIAGNOSIS — Z6841 Body Mass Index (BMI) 40.0 and over, adult: Secondary | ICD-10-CM | POA: Diagnosis not present

## 2021-06-30 DIAGNOSIS — F419 Anxiety disorder, unspecified: Secondary | ICD-10-CM | POA: Diagnosis not present

## 2021-07-03 ENCOUNTER — Ambulatory Visit: Payer: Medicare Other | Admitting: Podiatry

## 2021-07-11 DIAGNOSIS — R220 Localized swelling, mass and lump, head: Secondary | ICD-10-CM | POA: Diagnosis not present

## 2021-07-11 DIAGNOSIS — I672 Cerebral atherosclerosis: Secondary | ICD-10-CM | POA: Diagnosis not present

## 2021-07-11 DIAGNOSIS — K110 Atrophy of salivary gland: Secondary | ICD-10-CM | POA: Diagnosis not present

## 2021-07-28 DIAGNOSIS — I871 Compression of vein: Secondary | ICD-10-CM | POA: Diagnosis not present

## 2021-07-28 DIAGNOSIS — I6522 Occlusion and stenosis of left carotid artery: Secondary | ICD-10-CM | POA: Diagnosis not present

## 2021-07-28 DIAGNOSIS — E041 Nontoxic single thyroid nodule: Secondary | ICD-10-CM | POA: Diagnosis not present

## 2021-07-28 DIAGNOSIS — I82C12 Acute embolism and thrombosis of left internal jugular vein: Secondary | ICD-10-CM | POA: Diagnosis not present

## 2021-07-28 DIAGNOSIS — Q278 Other specified congenital malformations of peripheral vascular system: Secondary | ICD-10-CM | POA: Diagnosis not present

## 2021-07-28 DIAGNOSIS — M47812 Spondylosis without myelopathy or radiculopathy, cervical region: Secondary | ICD-10-CM | POA: Diagnosis not present

## 2021-08-06 ENCOUNTER — Encounter (HOSPITAL_COMMUNITY): Payer: Medicare Other

## 2021-08-06 ENCOUNTER — Encounter: Payer: Medicare Other | Admitting: Vascular Surgery

## 2021-08-07 ENCOUNTER — Emergency Department (HOSPITAL_COMMUNITY)
Admission: EM | Admit: 2021-08-07 | Discharge: 2021-08-08 | Disposition: A | Discharge status: Home / Self Care | Payer: Medicare Other | Attending: Emergency Medicine | Admitting: Emergency Medicine

## 2021-08-07 ENCOUNTER — Encounter (HOSPITAL_COMMUNITY): Payer: Self-pay | Admitting: Emergency Medicine

## 2021-08-07 ENCOUNTER — Other Ambulatory Visit: Payer: Self-pay

## 2021-08-07 ENCOUNTER — Emergency Department (HOSPITAL_COMMUNITY): Payer: Medicare Other

## 2021-08-07 DIAGNOSIS — Z79899 Other long term (current) drug therapy: Secondary | ICD-10-CM | POA: Insufficient documentation

## 2021-08-07 DIAGNOSIS — Z7901 Long term (current) use of anticoagulants: Secondary | ICD-10-CM | POA: Insufficient documentation

## 2021-08-07 DIAGNOSIS — R0602 Shortness of breath: Secondary | ICD-10-CM | POA: Diagnosis not present

## 2021-08-07 DIAGNOSIS — I82C12 Acute embolism and thrombosis of left internal jugular vein: Secondary | ICD-10-CM | POA: Diagnosis not present

## 2021-08-07 DIAGNOSIS — Z7982 Long term (current) use of aspirin: Secondary | ICD-10-CM | POA: Insufficient documentation

## 2021-08-07 DIAGNOSIS — R42 Dizziness and giddiness: Secondary | ICD-10-CM | POA: Diagnosis present

## 2021-08-07 LAB — COMPREHENSIVE METABOLIC PANEL
ALT: 22 U/L (ref 0–44)
AST: 29 U/L (ref 15–41)
Albumin: 4.1 g/dL (ref 3.5–5.0)
Alkaline Phosphatase: 79 U/L (ref 38–126)
Anion gap: 9 (ref 5–15)
BUN: 27 mg/dL — ABNORMAL HIGH (ref 8–23)
CO2: 27 mmol/L (ref 22–32)
Calcium: 9.3 mg/dL (ref 8.9–10.3)
Chloride: 99 mmol/L (ref 98–111)
Creatinine, Ser: 1.25 mg/dL — ABNORMAL HIGH (ref 0.61–1.24)
GFR, Estimated: 56 mL/min — ABNORMAL LOW (ref >60)
Glucose, Bld: 119 mg/dL — ABNORMAL HIGH (ref 70–99)
Potassium: 4.7 mmol/L (ref 3.5–5.1)
Sodium: 135 mmol/L (ref 135–145)
Total Bilirubin: 0.9 mg/dL (ref 0.3–1.2)
Total Protein: 7.2 g/dL (ref 6.5–8.1)

## 2021-08-07 LAB — CBC WITH DIFFERENTIAL/PLATELET
Abs Immature Granulocytes: 0.02 10*3/uL (ref 0.00–0.07)
Basophils Absolute: 0 10*3/uL (ref 0.0–0.1)
Basophils Relative: 0 %
Eosinophils Absolute: 0.2 10*3/uL (ref 0.0–0.5)
Eosinophils Relative: 3 %
HCT: 43.1 % (ref 39.0–52.0)
Hemoglobin: 14.2 g/dL (ref 13.0–17.0)
Immature Granulocytes: 0 %
Lymphocytes Relative: 25 %
Lymphs Abs: 2 10*3/uL (ref 0.7–4.0)
MCH: 31.7 pg (ref 26.0–34.0)
MCHC: 32.9 g/dL (ref 30.0–36.0)
MCV: 96.2 fL (ref 80.0–100.0)
Monocytes Absolute: 0.7 10*3/uL (ref 0.1–1.0)
Monocytes Relative: 9 %
Neutro Abs: 5.1 10*3/uL (ref 1.7–7.7)
Neutrophils Relative %: 63 %
Platelets: 319 10*3/uL (ref 150–400)
RBC: 4.48 MIL/uL (ref 4.22–5.81)
RDW: 12 % (ref 11.5–15.5)
WBC: 8.1 10*3/uL (ref 4.0–10.5)
nRBC: 0 % (ref 0.0–0.2)

## 2021-08-07 LAB — URINALYSIS, ROUTINE W REFLEX MICROSCOPIC
Bilirubin Urine: NEGATIVE
Glucose, UA: NEGATIVE mg/dL
Hgb urine dipstick: NEGATIVE
Ketones, ur: NEGATIVE mg/dL
Leukocytes,Ua: NEGATIVE
Nitrite: NEGATIVE
Protein, ur: NEGATIVE mg/dL
Specific Gravity, Urine: 1.009 (ref 1.005–1.030)
pH: 5 (ref 5.0–8.0)

## 2021-08-07 LAB — BRAIN NATRIURETIC PEPTIDE: B Natriuretic Peptide: 41.8 pg/mL (ref 0.0–100.0)

## 2021-08-07 LAB — ETHANOL: Alcohol, Ethyl (B): <10 mg/dL (ref <10)

## 2021-08-07 LAB — PROTIME-INR
INR: 1 (ref 0.8–1.2)
Prothrombin Time: 13 seconds (ref 11.4–15.2)

## 2021-08-07 LAB — TROPONIN I (HIGH SENSITIVITY): Troponin I (High Sensitivity): 15 ng/L (ref <18)

## 2021-08-07 NOTE — ED Triage Notes (Addendum)
Pt's daughter states that she brought pt in tonight due to increased dizziness. She states that she has not been able to get the pt in to see a specialist. States that he has been "SOB for multiple years, dizzy x a few months, and diagnosed with jugular vein occlusion x 3 days ago." ?

## 2021-08-08 DIAGNOSIS — I82C12 Acute embolism and thrombosis of left internal jugular vein: Secondary | ICD-10-CM | POA: Diagnosis not present

## 2021-08-08 LAB — TROPONIN I (HIGH SENSITIVITY): Troponin I (High Sensitivity): 14 ng/L (ref <18)

## 2021-08-08 MED ORDER — APIXABAN 5 MG PO TABS: 5.0000 mg | ORAL_TABLET | Freq: Two times a day (BID) | ORAL | 0 refills | Status: AC

## 2021-08-08 MED ORDER — APIXABAN 5 MG PO TABS
5.0000 mg | ORAL_TABLET | Freq: Once | ORAL | Status: AC
  Administered 2021-08-08: 5 mg via ORAL
  Filled 2021-08-08: qty 1

## 2021-08-08 NOTE — ED Provider Notes (Signed)
?[facility] ?Provider Note ? ? ?CSN: [id number] ?Arrival date & time: 08/07/21  1946 ? ?  ? ?History ? ?Chief Complaint  ?Patient presents with  ? Dizziness  ? ? ?Clinton Shea. is a 88 y.o. male. ? ?The history is provided by the patient, a relative and medical records.  ?Dizziness ?Clinton Shea. is a 88 y.o. male who presents to the Emergency Department complaining of multiple complaints.  He presents to the ED accompanied by his granddaughter for evaluation of multiple complaints.  They are very utmost concern is that he was seen by his PCP and had abnormal CT imaging of his head and they do not have prompt follow-up.  He had a CT head and neck ordered on March 17 and performed on the 20th.  The CT scan showed scalp varus callosities and the CT neck with concerning for a chronic IJ occlusion.  His only past medical history is lymphangitis.  He complains of several months to years of dizziness that is progressive over the last month.  He also has progressive shortness of breath and dyspnea on exertion.  He has memory and word finding issues that has been ongoing for 1 month or greater.  He was also recently told that his PCP is retiring and he will need to find a new PCP.  All of the symptoms are subacute.  ? ? ?Past medical history of lymphangitis, prostate cancer. ?Home Medications ?Prior to Admission medications   ?Medication Sig Start Date End Date Taking? Authorizing Provider  ?apixaban (ELIQUIS) 5 MG TABS tablet Take 1 tablet (5 mg total) by mouth 2 (two) times daily. 08/08/21  Yes Quintella Reichert, MD  ?aspirin EC 81 MG tablet Take 81 mg by mouth daily. Swallow whole.    [provider]  ?Furosemide (LASIX PO) Take by mouth.    [provider]  ?   ? ?Allergies    ?Tylenol [acetaminophen] and Sulfa antibiotics   ? ?Review of Systems   ?Review of Systems  ?Neurological:  Positive for dizziness.  ?All other systems reviewed and are  negative. ? ?Physical Exam ?Updated Vital Signs ?BP 110/81   Pulse 81   Temp 97.8 ?F (36.6 ?C) (Oral)   Resp 17   Ht '6\' 3"'$  (1.905 m)   Wt (!) 140.3 kg   SpO2 100%   BMI 38.66 kg/m?  ?Physical Exam ?Vitals and nursing note reviewed.  ?Constitutional:   ?   Appearance: He is well-developed.  ?HENT:  ?   Head: Normocephalic and atraumatic.  ?Cardiovascular:  ?   Rate and Rhythm: Normal rate and regular rhythm.  ?   Heart sounds: No murmur heard. ?Pulmonary:  ?   Effort: Pulmonary effort is normal. No respiratory distress.  ?   Breath sounds: Normal breath sounds.  ?Abdominal:  ?   Palpations: Abdomen is soft.  ?   Tenderness: There is no abdominal tenderness. There is no guarding or rebound.  ?Musculoskeletal:     ?   General: No tenderness.  ?   Comments: 2-3 + pitting edema to BLE, right greater than left.   ?Skin: ?   General: Skin is warm and dry.  ?Neurological:  ?   Mental Status: He is alert and oriented to person, place, and time.  ?   Comments: 4/5 strength in BUE, 4/5 strength in RLE, 4+/5 strength in LLE.    ?Psychiatric:     ?   Behavior: Behavior normal.  ? ? ?  ED Results / Procedures / Treatments   ?Labs ?(all labs ordered are listed, but only abnormal results are displayed) ?Labs Reviewed  ?COMPREHENSIVE METABOLIC PANEL - Abnormal; Notable for the following components:  ?    Result Value  ? Glucose, Bld 119 (*)   ? BUN 27 (*)   ? Creatinine, Ser 1.25 (*)   ? GFR, Estimated 56 (*)   ? All other components within normal limits  ?URINALYSIS, ROUTINE W REFLEX MICROSCOPIC - Abnormal; Notable for the following components:  ? Color, Urine STRAW (*)   ? All other components within normal limits  ?CBC WITH DIFFERENTIAL/PLATELET  ?PROTIME-INR  ?BRAIN NATRIURETIC PEPTIDE  ?ETHANOL  ?RAPID URINE DRUG SCREEN, HOSP PERFORMED  ?TROPONIN I (HIGH SENSITIVITY)  ?TROPONIN I (HIGH SENSITIVITY)  ? ? ?EKG ?EKG Interpretation ? ?Date/Time:  Thursday August 07 2021 20:46:32 EDT ?Ventricular Rate:  85 ?PR  Interval:  178 ?QRS Duration: 126 ?QT Interval:  396 ?QTC Calculation: 471 ?R Axis:   -48 ?Text Interpretation: Normal sinus rhythm Left axis deviation Left ventricular hypertrophy with QRS widening and repolarization abnormality ( R in aVL , Cornell product ) Cannot rule out Septal infarct , age undetermined Abnormal ECG No previous ECGs available Confirmed by Quintella Reichert [phone]) on 08/07/2021 11:28:01 PM ? ?Radiology ?DG Chest 2 View ? ?Result Date: 08/07/2021 ?CLINICAL DATA:  Shortness of breath EXAM: CHEST - 2 VIEW COMPARISON:  Chest radiograph dated March 23, 2021 FINDINGS: The heart size and mediastinal contours are within normal limits. Both lungs are clear. The visualized skeletal structures are unremarkable. IMPRESSION: No active cardiopulmonary disease. Electronically Signed   By: Keane Police D.O.   On: 08/07/2021 21:09   ? ?Procedures ?Procedures  ? ? ?Medications Ordered in ED ?Medications  ?apixaban (ELIQUIS) tablet 5 mg (5 mg Oral Given 08/08/21 0123)  ? ? ?ED Course/ Medical Decision Making/ A&P ?  ?                        ?Medical Decision Making ?Risk ?Prescription drug management. ? ? ?Patient here for evaluation of abnormal outpatient imaging that was performed on March 20.  He also has a host of complaints on review of systems which have been ongoing for 1 month or greater.  Discussed with patient recommendation for additional work-up in the emergency department plus or minus admission to the hospital.  Patient refuses additional work-up as well as admission at this time.  Granddaughter was present for the conversation.  Discussed recommendation to obtain CT scan to rule out pulmonary embolism and he declines.  Given finding of chronic IJ, thrombus, concern for possible PE recommend that he starts anticoagulation.  Discussed bleeding risks associated with initiation of anticoagulation.  Will refer to vascular surgery regarding his thrombus.  Discussed importance of outpatient follow-up as  well as return precautions. ? ? ? ? ? ? ? ?Final Clinical Impression(s) / ED Diagnoses ?Final diagnoses:  ?Internal jugular vein thrombosis, left (HCC)  ? ? ?Rx / DC Orders ?ED Discharge Orders   ? ?      Ordered  ?  apixaban (ELIQUIS) 5 MG TABS tablet  2 times daily       ? 08/08/21 0111  ?  Ambulatory referral to Vascular Surgery       ?Comments: Has IJ thrombus diagnosed on outpatient CT 3/20 [facility]).  Needs urgent follow up.  Refused admission/work up in ED.  ? 08/08/21 0112  ? ?  ?  ? ?  ? ? ?  ?  Quintella Reichert, MD ?08/08/21 [phone] ? ?

## 2021-08-12 ENCOUNTER — Encounter: Payer: Self-pay | Admitting: Vascular Surgery

## 2021-08-12 ENCOUNTER — Ambulatory Visit (INDEPENDENT_AMBULATORY_CARE_PROVIDER_SITE_OTHER): Payer: Medicare Other | Admitting: Vascular Surgery

## 2021-08-12 DIAGNOSIS — Z86718 Personal history of other venous thrombosis and embolism: Secondary | ICD-10-CM | POA: Insufficient documentation

## 2021-08-12 NOTE — Progress Notes (Signed)
? ? ?Patient name: Clinton Shea. MRN: [id number] DOB: 02-Jul-1932 Sex: male ? ?REASON FOR CONSULT: Chronic left IJ occlusion ? ?HPI: ?Clemens Lachman. is a 88 y.o. male, with multiple medical problems including prostate cancer that presents for evaluation of chronic left IJ occlusion.  Sounds like he has had dizziness and noticed some spots on the back of his scalp.  His granddaughter is helping care for him and this prompted a CT scan of his head by his PCP.  The CT head was followed by a CT neck with contrast identifying chronic proximal left ICA occlusion with multiple collaterals.  He was placed on anticoagulation in the ED.  Now on Eliquis.  The granddaughter has the notes today from the ED and sounds like he wanted to be discharged without further work-up including for PE.  Having a difficult time affording the anticoagulation.  No history of blood clots before this event.  Denies any instrumentation in the left internal jugular vein. ? ?Past Medical History:  ?Diagnosis Date  ? Arthritis   ? Cancer ([place]) 2013  [place]  ? Prostate  ? Cellulitis   ? right leg  ? Dyspnea   ? Pneumonia   ? last time 1955  ? Vertigo   ? 11/10/17- "hasn't had an issue in months"  ? ? ?Past Surgical History:  ?Procedure Laterality Date  ? COLONOSCOPY W/ POLYPECTOMY    ? DEBRIDEMENT AND CLOSURE WOUND Right 09/08/2017  ? Procedure: RIGHT KNEE WOUND EXCISION WITH PLACEMENT OF ACELL AND VAC;  Surgeon: Wallace Going, DO;  Location: [facility];  Service: Plastics;  Laterality: Right;  ? INCISION AND DRAINAGE OF WOUND Right 11/10/2017  ? Procedure: IRRIGATION AND DEBRIDEMENT RIGHT KNEE WOUND WITH POSSIBLE ACELL PLACEMENT;  Surgeon: Wallace Going, DO;  Location: [facility];  Service: Plastics;  Laterality: Right;  ? KNEE SURGERY Right 04/21/2017  ? remove bursa  ? NASAL HEMORRHAGE CONTROL    ? as a child  ? PROSTATECTOMY  2013  ? TONSILLECTOMY    ? ? ?History reviewed. No pertinent family history. ? ?SOCIAL  HISTORY: ?Social History  ? ?Socioeconomic History  ? Marital status: Married  ?  Spouse name: Not on file  ? Number of children: Not on file  ? Years of education: Not on file  ? Highest education level: Not on file  ?Occupational History  ? Not on file  ?Tobacco Use  ? Smoking status: Former  ?  Years: 20.00  ?  Types: Cigarettes  ?  Quit date: 08/23/1969  ?  Years since quitting: 52.0  ? Smokeless tobacco: Never  ?Vaping Use  ? Vaping Use: Not on file  ?Substance and Sexual Activity  ? Alcohol use: Not Currently  ? Drug use: Never  ? Sexual activity: Not on file  ?Other Topics Concern  ? Not on file  ?Social History Narrative  ? Not on file  ? ?Social Determinants of Health  ? ?Financial Resource Strain: Not on file  ?Food Insecurity: Not on file  ?Transportation Needs: Not on file  ?Physical Activity: Not on file  ?Stress: Not on file  ?Social Connections: Not on file  ?Intimate Partner Violence: Not on file  ? ? ?Allergies  ?Allergen Reactions  ? Tylenol [Acetaminophen]   ?  Severe indigestion  ? Sulfa Antibiotics Nausea Only  ?  Burning esophagus  ? ? ?Current Outpatient Medications  ?Medication Sig Dispense Refill  ? apixaban (ELIQUIS) 5 MG  TABS tablet Take 1 tablet (5 mg total) by mouth 2 (two) times daily. 60 tablet 0  ? aspirin EC 81 MG tablet Take 81 mg by mouth daily. Swallow whole.    ? Furosemide (LASIX PO) Take by mouth.    ? ?No current facility-administered medications for this visit.  ? ? ?REVIEW OF SYSTEMS:  ?'[X]'$  denotes positive finding, '[ ]'$  denotes negative finding ?Cardiac  Comments:  ?Chest pain or chest pressure:    ?Shortness of breath upon exertion:    ?Short of breath when lying flat:    ?Irregular heart rhythm:    ?    ?Vascular    ?Pain in calf, thigh, or hip brought on by ambulation:    ?Pain in feet at night that wakes you up from your sleep:     ?Blood clot in your veins:    ?Leg swelling:     ?    ?Pulmonary    ?Oxygen at home:    ?Productive cough:     ?Wheezing:     ?    ?Neurologic     ?Sudden weakness in arms or legs:     ?Sudden numbness in arms or legs:     ?Sudden onset of difficulty speaking or slurred speech:    ?Temporary loss of vision in one eye:     ?Problems with dizziness:     ?    ?Gastrointestinal    ?Blood in stool:     ?Vomited blood:     ?    ?Genitourinary    ?Burning when urinating:     ?Blood in urine:    ?    ?Psychiatric    ?Major depression:     ?    ?Hematologic    ?Bleeding problems:    ?Problems with blood clotting too easily:    ?    ?Skin    ?Rashes or ulcers:    ?    ?Constitutional    ?Fever or chills:    ? ? ?PHYSICAL EXAM: ?Vitals:  ? 08/12/21 1425 08/12/21 1430  ?BP: 126/81 115/73  ?Pulse: 93 95  ?Resp: 18   ?Temp: 98 ?F (36.7 ?C)   ?TempSrc: Temporal   ?Weight: (!) 338 lb (153.3 kg)   ?Height: '6\' 3"'$  (1.905 m)   ? ? ?GENERAL: The patient is a well-nourished male, in no acute distress. The vital signs are documented above. ?CARDIAC: There is a regular rate and rhythm.  ?VASCULAR:  ?Hard to appreciate pedal pulses with his obesity ?No palpable pedal pulses but notable bilateral lower extremity edema ?PULMONARY: No respiratory distress ?ABDOMEN: Soft and non-tender. ?MUSCULOSKELETAL: There are no major deformities or cyanosis. ?NEUROLOGIC: No focal weakness or paresthesias are detected. ?SKIN: There are no ulcers or rashes noted. ?PSYCHIATRIC: The patient has a normal affect. ? ?DATA:  ? ?CLINICAL DATA: Follow-up abnormal head CT. ? ?EXAM: ?CT NECK WITH CONTRAST ? ?TECHNIQUE: ?Multidetector CT imaging of the neck was performed using the ?standard protocol following the bolus administration of intravenous ?contrast. ? ?RADIATION DOSE REDUCTION: This exam was performed according to the ?departmental dose-optimization program which includes automated ?exposure control, adjustment of the mA and/or kV according to ?patient size and/or use of iterative reconstruction technique. ? ?CONTRAST: 100 cc Isovue 370 intravenous ? ?COMPARISON: None. ? ?FINDINGS: ?Pharynx and  larynx: No visible mass or inflammation. ? ?Salivary glands: Symmetric nodules which may be islands of remaining ?parenchyma and/or small lymph nodes, overall non worrisome. The ?largest nodule is in the right  parotid and shows layering high ?density, likely proteinaceous cyst (13 mm). ? ?Thyroid: Isthmic nodule measuring 12 mm. No followup recommended ?(ref: J Am Coll Radiol. 2015 Feb;12(2): 143-50). (ref: J Am Coll ?Radiol. 2015 Feb;12(2): 143-50). ? ?Lymph nodes: None enlarged or abnormal density. ? ?Vascular: The enlarged left occipital veins are associated with an ?occluded left IJ at the jugular foramen with contiguous occlusion at ?the lower sigmoid dural sinus. This is not appear purely congenital, ?there is soft tissue density at the faintly enhancing upper IJ, ?without destructive features or enhancing mass, presumably bland ?thrombosis. ? ?Limited intracranial: Negative ? ?Visualized orbits: Not covered. ? ?Mastoids and visualized paranasal sinuses: Partial opacification of ?the right maxillary sinus. ? ?Skeleton: Generalized cervical spine degeneration with multilevel ?facet and uncovertebral spurs encroaching on the foramina. ? ?Upper chest: No acute finding ? ?IMPRESSION: ?Enlarged left suboccipital veins are associated with chronic lower ?left sigmoid and left upper IJ occlusion. ? ?No acute or aggressive finding in the neck.  ? ?CLINICAL DATA: 88 year old male with palpable abnormality of the ?skull discovered several months ago. No known injury. ? ?EXAM: ?CT HEAD WITHOUT CONTRAST ? ?TECHNIQUE: ?Contiguous axial images were obtained from the base of the skull ?through the vertex without intravenous contrast. ? ?RADIATION DOSE REDUCTION: This exam was performed according to the ?departmental dose-optimization program which includes automated ?exposure control, adjustment of the mA and/or kV according to ?patient size and/or use of iterative reconstruction technique. ? ?COMPARISON:  None. ? ?FINDINGS: ?Brain: Cerebral volume is within normal limits for age. No midline ?shift, ventriculomegaly, mass effect, evidence of mass lesion, ?intracranial hemorrhage or evidence of cortically based acute ?infarction. Gene

## 2021-10-16 DIAGNOSIS — Z0000 Encounter for general adult medical examination without abnormal findings: Secondary | ICD-10-CM | POA: Diagnosis not present

## 2021-10-16 DIAGNOSIS — R972 Elevated prostate specific antigen [PSA]: Secondary | ICD-10-CM | POA: Diagnosis not present

## 2021-10-16 DIAGNOSIS — Z87891 Personal history of nicotine dependence: Secondary | ICD-10-CM | POA: Diagnosis not present

## 2021-10-16 DIAGNOSIS — Z79899 Other long term (current) drug therapy: Secondary | ICD-10-CM | POA: Diagnosis not present

## 2021-10-16 DIAGNOSIS — I6523 Occlusion and stenosis of bilateral carotid arteries: Secondary | ICD-10-CM | POA: Insufficient documentation

## 2021-10-16 DIAGNOSIS — R600 Localized edema: Secondary | ICD-10-CM | POA: Diagnosis not present

## 2021-10-16 DIAGNOSIS — I739 Peripheral vascular disease, unspecified: Secondary | ICD-10-CM | POA: Diagnosis not present

## 2021-10-16 DIAGNOSIS — Z1329 Encounter for screening for other suspected endocrine disorder: Secondary | ICD-10-CM | POA: Diagnosis not present

## 2021-10-16 DIAGNOSIS — E079 Disorder of thyroid, unspecified: Secondary | ICD-10-CM | POA: Diagnosis not present

## 2021-10-16 DIAGNOSIS — R7989 Other specified abnormal findings of blood chemistry: Secondary | ICD-10-CM | POA: Diagnosis not present

## 2021-10-16 DIAGNOSIS — M5136 Other intervertebral disc degeneration, lumbar region: Secondary | ICD-10-CM | POA: Diagnosis not present

## 2021-10-16 DIAGNOSIS — E782 Mixed hyperlipidemia: Secondary | ICD-10-CM | POA: Diagnosis not present

## 2021-10-16 DIAGNOSIS — Z760 Encounter for issue of repeat prescription: Secondary | ICD-10-CM | POA: Diagnosis not present

## 2021-10-16 DIAGNOSIS — Z6841 Body Mass Index (BMI) 40.0 and over, adult: Secondary | ICD-10-CM | POA: Diagnosis not present

## 2021-10-16 DIAGNOSIS — F411 Generalized anxiety disorder: Secondary | ICD-10-CM | POA: Diagnosis not present

## 2021-10-16 DIAGNOSIS — Z9079 Acquired absence of other genital organ(s): Secondary | ICD-10-CM | POA: Diagnosis not present

## 2021-10-23 DIAGNOSIS — I739 Peripheral vascular disease, unspecified: Secondary | ICD-10-CM | POA: Diagnosis not present

## 2021-10-23 DIAGNOSIS — F411 Generalized anxiety disorder: Secondary | ICD-10-CM | POA: Diagnosis not present

## 2021-10-23 DIAGNOSIS — Z8546 Personal history of malignant neoplasm of prostate: Secondary | ICD-10-CM | POA: Diagnosis not present

## 2021-10-23 DIAGNOSIS — G8929 Other chronic pain: Secondary | ICD-10-CM | POA: Diagnosis not present

## 2021-10-23 DIAGNOSIS — M47816 Spondylosis without myelopathy or radiculopathy, lumbar region: Secondary | ICD-10-CM | POA: Diagnosis not present

## 2021-10-23 DIAGNOSIS — M5136 Other intervertebral disc degeneration, lumbar region: Secondary | ICD-10-CM | POA: Diagnosis not present

## 2021-10-23 DIAGNOSIS — Z6841 Body Mass Index (BMI) 40.0 and over, adult: Secondary | ICD-10-CM | POA: Diagnosis not present

## 2021-10-23 DIAGNOSIS — M542 Cervicalgia: Secondary | ICD-10-CM | POA: Diagnosis not present

## 2021-10-23 DIAGNOSIS — Z7982 Long term (current) use of aspirin: Secondary | ICD-10-CM | POA: Diagnosis not present

## 2021-10-23 DIAGNOSIS — E039 Hypothyroidism, unspecified: Secondary | ICD-10-CM | POA: Diagnosis not present

## 2021-10-23 DIAGNOSIS — H9190 Unspecified hearing loss, unspecified ear: Secondary | ICD-10-CM | POA: Diagnosis not present

## 2021-10-23 DIAGNOSIS — Z87891 Personal history of nicotine dependence: Secondary | ICD-10-CM | POA: Diagnosis not present

## 2021-10-23 DIAGNOSIS — Z9079 Acquired absence of other genital organ(s): Secondary | ICD-10-CM | POA: Diagnosis not present

## 2021-10-27 DIAGNOSIS — F411 Generalized anxiety disorder: Secondary | ICD-10-CM | POA: Diagnosis not present

## 2021-10-27 DIAGNOSIS — M5136 Other intervertebral disc degeneration, lumbar region: Secondary | ICD-10-CM | POA: Diagnosis not present

## 2021-10-27 DIAGNOSIS — M542 Cervicalgia: Secondary | ICD-10-CM | POA: Diagnosis not present

## 2021-10-27 DIAGNOSIS — M47816 Spondylosis without myelopathy or radiculopathy, lumbar region: Secondary | ICD-10-CM | POA: Diagnosis not present

## 2021-10-27 DIAGNOSIS — G8929 Other chronic pain: Secondary | ICD-10-CM | POA: Diagnosis not present

## 2021-10-27 DIAGNOSIS — I739 Peripheral vascular disease, unspecified: Secondary | ICD-10-CM | POA: Diagnosis not present

## 2021-10-30 DIAGNOSIS — M47816 Spondylosis without myelopathy or radiculopathy, lumbar region: Secondary | ICD-10-CM | POA: Diagnosis not present

## 2021-10-30 DIAGNOSIS — M5136 Other intervertebral disc degeneration, lumbar region: Secondary | ICD-10-CM | POA: Diagnosis not present

## 2021-10-30 DIAGNOSIS — F411 Generalized anxiety disorder: Secondary | ICD-10-CM | POA: Diagnosis not present

## 2021-10-30 DIAGNOSIS — M542 Cervicalgia: Secondary | ICD-10-CM | POA: Diagnosis not present

## 2021-10-30 DIAGNOSIS — G8929 Other chronic pain: Secondary | ICD-10-CM | POA: Diagnosis not present

## 2021-10-30 DIAGNOSIS — I739 Peripheral vascular disease, unspecified: Secondary | ICD-10-CM | POA: Diagnosis not present

## 2021-11-04 DIAGNOSIS — G8929 Other chronic pain: Secondary | ICD-10-CM | POA: Diagnosis not present

## 2021-11-04 DIAGNOSIS — M542 Cervicalgia: Secondary | ICD-10-CM | POA: Diagnosis not present

## 2021-11-04 DIAGNOSIS — I739 Peripheral vascular disease, unspecified: Secondary | ICD-10-CM | POA: Diagnosis not present

## 2021-11-04 DIAGNOSIS — F411 Generalized anxiety disorder: Secondary | ICD-10-CM | POA: Diagnosis not present

## 2021-11-04 DIAGNOSIS — M47816 Spondylosis without myelopathy or radiculopathy, lumbar region: Secondary | ICD-10-CM | POA: Diagnosis not present

## 2021-11-04 DIAGNOSIS — M5136 Other intervertebral disc degeneration, lumbar region: Secondary | ICD-10-CM | POA: Diagnosis not present

## 2021-11-06 DIAGNOSIS — M47816 Spondylosis without myelopathy or radiculopathy, lumbar region: Secondary | ICD-10-CM | POA: Diagnosis not present

## 2021-11-06 DIAGNOSIS — I739 Peripheral vascular disease, unspecified: Secondary | ICD-10-CM | POA: Diagnosis not present

## 2021-11-06 DIAGNOSIS — M542 Cervicalgia: Secondary | ICD-10-CM | POA: Diagnosis not present

## 2021-11-06 DIAGNOSIS — F411 Generalized anxiety disorder: Secondary | ICD-10-CM | POA: Diagnosis not present

## 2021-11-06 DIAGNOSIS — M5136 Other intervertebral disc degeneration, lumbar region: Secondary | ICD-10-CM | POA: Diagnosis not present

## 2021-11-06 DIAGNOSIS — G8929 Other chronic pain: Secondary | ICD-10-CM | POA: Diagnosis not present

## 2021-11-10 DIAGNOSIS — M47816 Spondylosis without myelopathy or radiculopathy, lumbar region: Secondary | ICD-10-CM | POA: Diagnosis not present

## 2021-11-10 DIAGNOSIS — M5136 Other intervertebral disc degeneration, lumbar region: Secondary | ICD-10-CM | POA: Diagnosis not present

## 2021-11-10 DIAGNOSIS — F411 Generalized anxiety disorder: Secondary | ICD-10-CM | POA: Diagnosis not present

## 2021-11-10 DIAGNOSIS — M542 Cervicalgia: Secondary | ICD-10-CM | POA: Diagnosis not present

## 2021-11-10 DIAGNOSIS — I739 Peripheral vascular disease, unspecified: Secondary | ICD-10-CM | POA: Diagnosis not present

## 2021-11-10 DIAGNOSIS — G8929 Other chronic pain: Secondary | ICD-10-CM | POA: Diagnosis not present

## 2021-11-14 DIAGNOSIS — G8929 Other chronic pain: Secondary | ICD-10-CM | POA: Diagnosis not present

## 2021-11-14 DIAGNOSIS — F411 Generalized anxiety disorder: Secondary | ICD-10-CM | POA: Diagnosis not present

## 2021-11-14 DIAGNOSIS — M542 Cervicalgia: Secondary | ICD-10-CM | POA: Diagnosis not present

## 2021-11-14 DIAGNOSIS — M47816 Spondylosis without myelopathy or radiculopathy, lumbar region: Secondary | ICD-10-CM | POA: Diagnosis not present

## 2021-11-14 DIAGNOSIS — I739 Peripheral vascular disease, unspecified: Secondary | ICD-10-CM | POA: Diagnosis not present

## 2021-11-14 DIAGNOSIS — M5136 Other intervertebral disc degeneration, lumbar region: Secondary | ICD-10-CM | POA: Diagnosis not present

## 2021-11-19 DIAGNOSIS — G8929 Other chronic pain: Secondary | ICD-10-CM | POA: Diagnosis not present

## 2021-11-19 DIAGNOSIS — M5136 Other intervertebral disc degeneration, lumbar region: Secondary | ICD-10-CM | POA: Diagnosis not present

## 2021-11-19 DIAGNOSIS — F411 Generalized anxiety disorder: Secondary | ICD-10-CM | POA: Diagnosis not present

## 2021-11-19 DIAGNOSIS — M542 Cervicalgia: Secondary | ICD-10-CM | POA: Diagnosis not present

## 2021-11-19 DIAGNOSIS — I739 Peripheral vascular disease, unspecified: Secondary | ICD-10-CM | POA: Diagnosis not present

## 2021-11-19 DIAGNOSIS — M47816 Spondylosis without myelopathy or radiculopathy, lumbar region: Secondary | ICD-10-CM | POA: Diagnosis not present

## 2021-11-22 DIAGNOSIS — G8929 Other chronic pain: Secondary | ICD-10-CM | POA: Diagnosis not present

## 2021-11-22 DIAGNOSIS — H9190 Unspecified hearing loss, unspecified ear: Secondary | ICD-10-CM | POA: Diagnosis not present

## 2021-11-22 DIAGNOSIS — Z9079 Acquired absence of other genital organ(s): Secondary | ICD-10-CM | POA: Diagnosis not present

## 2021-11-22 DIAGNOSIS — F411 Generalized anxiety disorder: Secondary | ICD-10-CM | POA: Diagnosis not present

## 2021-11-22 DIAGNOSIS — I739 Peripheral vascular disease, unspecified: Secondary | ICD-10-CM | POA: Diagnosis not present

## 2021-11-22 DIAGNOSIS — Z8546 Personal history of malignant neoplasm of prostate: Secondary | ICD-10-CM | POA: Diagnosis not present

## 2021-11-22 DIAGNOSIS — Z6841 Body Mass Index (BMI) 40.0 and over, adult: Secondary | ICD-10-CM | POA: Diagnosis not present

## 2021-11-22 DIAGNOSIS — M47816 Spondylosis without myelopathy or radiculopathy, lumbar region: Secondary | ICD-10-CM | POA: Diagnosis not present

## 2021-11-22 DIAGNOSIS — M542 Cervicalgia: Secondary | ICD-10-CM | POA: Diagnosis not present

## 2021-11-22 DIAGNOSIS — M5136 Other intervertebral disc degeneration, lumbar region: Secondary | ICD-10-CM | POA: Diagnosis not present

## 2021-11-22 DIAGNOSIS — Z87891 Personal history of nicotine dependence: Secondary | ICD-10-CM | POA: Diagnosis not present

## 2021-11-22 DIAGNOSIS — Z7982 Long term (current) use of aspirin: Secondary | ICD-10-CM | POA: Diagnosis not present

## 2021-11-22 DIAGNOSIS — E039 Hypothyroidism, unspecified: Secondary | ICD-10-CM | POA: Diagnosis not present

## 2021-11-25 DIAGNOSIS — M47816 Spondylosis without myelopathy or radiculopathy, lumbar region: Secondary | ICD-10-CM | POA: Diagnosis not present

## 2021-11-25 DIAGNOSIS — F411 Generalized anxiety disorder: Secondary | ICD-10-CM | POA: Diagnosis not present

## 2021-11-25 DIAGNOSIS — M542 Cervicalgia: Secondary | ICD-10-CM | POA: Diagnosis not present

## 2021-11-25 DIAGNOSIS — M5136 Other intervertebral disc degeneration, lumbar region: Secondary | ICD-10-CM | POA: Diagnosis not present

## 2021-11-25 DIAGNOSIS — I739 Peripheral vascular disease, unspecified: Secondary | ICD-10-CM | POA: Diagnosis not present

## 2021-11-25 DIAGNOSIS — G8929 Other chronic pain: Secondary | ICD-10-CM | POA: Diagnosis not present

## 2021-11-26 DIAGNOSIS — H401131 Primary open-angle glaucoma, bilateral, mild stage: Secondary | ICD-10-CM | POA: Diagnosis not present

## 2021-12-04 DIAGNOSIS — I739 Peripheral vascular disease, unspecified: Secondary | ICD-10-CM | POA: Diagnosis not present

## 2021-12-04 DIAGNOSIS — F411 Generalized anxiety disorder: Secondary | ICD-10-CM | POA: Diagnosis not present

## 2021-12-04 DIAGNOSIS — M47816 Spondylosis without myelopathy or radiculopathy, lumbar region: Secondary | ICD-10-CM | POA: Diagnosis not present

## 2021-12-04 DIAGNOSIS — G8929 Other chronic pain: Secondary | ICD-10-CM | POA: Diagnosis not present

## 2021-12-04 DIAGNOSIS — M5136 Other intervertebral disc degeneration, lumbar region: Secondary | ICD-10-CM | POA: Diagnosis not present

## 2021-12-04 DIAGNOSIS — M542 Cervicalgia: Secondary | ICD-10-CM | POA: Diagnosis not present

## 2021-12-08 DIAGNOSIS — M5136 Other intervertebral disc degeneration, lumbar region: Secondary | ICD-10-CM | POA: Diagnosis not present

## 2021-12-08 DIAGNOSIS — M47816 Spondylosis without myelopathy or radiculopathy, lumbar region: Secondary | ICD-10-CM | POA: Diagnosis not present

## 2021-12-08 DIAGNOSIS — F411 Generalized anxiety disorder: Secondary | ICD-10-CM | POA: Diagnosis not present

## 2021-12-08 DIAGNOSIS — I739 Peripheral vascular disease, unspecified: Secondary | ICD-10-CM | POA: Diagnosis not present

## 2021-12-08 DIAGNOSIS — M542 Cervicalgia: Secondary | ICD-10-CM | POA: Diagnosis not present

## 2021-12-08 DIAGNOSIS — G8929 Other chronic pain: Secondary | ICD-10-CM | POA: Diagnosis not present

## 2021-12-18 DIAGNOSIS — F411 Generalized anxiety disorder: Secondary | ICD-10-CM | POA: Diagnosis not present

## 2021-12-18 DIAGNOSIS — M542 Cervicalgia: Secondary | ICD-10-CM | POA: Diagnosis not present

## 2021-12-18 DIAGNOSIS — I739 Peripheral vascular disease, unspecified: Secondary | ICD-10-CM | POA: Diagnosis not present

## 2021-12-18 DIAGNOSIS — M47816 Spondylosis without myelopathy or radiculopathy, lumbar region: Secondary | ICD-10-CM | POA: Diagnosis not present

## 2021-12-18 DIAGNOSIS — M5136 Other intervertebral disc degeneration, lumbar region: Secondary | ICD-10-CM | POA: Diagnosis not present

## 2021-12-18 DIAGNOSIS — G8929 Other chronic pain: Secondary | ICD-10-CM | POA: Diagnosis not present

## 2021-12-29 DIAGNOSIS — R972 Elevated prostate specific antigen [PSA]: Secondary | ICD-10-CM | POA: Diagnosis not present

## 2021-12-29 DIAGNOSIS — C61 Malignant neoplasm of prostate: Secondary | ICD-10-CM | POA: Diagnosis not present

## 2022-02-12 DIAGNOSIS — C61 Malignant neoplasm of prostate: Secondary | ICD-10-CM | POA: Diagnosis not present

## 2022-02-12 DIAGNOSIS — R972 Elevated prostate specific antigen [PSA]: Secondary | ICD-10-CM | POA: Diagnosis not present

## 2022-04-13 DIAGNOSIS — Z23 Encounter for immunization: Secondary | ICD-10-CM | POA: Diagnosis not present

## 2022-04-21 DIAGNOSIS — Z0000 Encounter for general adult medical examination without abnormal findings: Secondary | ICD-10-CM | POA: Diagnosis not present

## 2022-04-21 DIAGNOSIS — F411 Generalized anxiety disorder: Secondary | ICD-10-CM | POA: Diagnosis not present

## 2022-04-21 DIAGNOSIS — D075 Carcinoma in situ of prostate: Secondary | ICD-10-CM | POA: Diagnosis not present

## 2022-04-21 DIAGNOSIS — Z6841 Body Mass Index (BMI) 40.0 and over, adult: Secondary | ICD-10-CM | POA: Diagnosis not present

## 2022-04-21 DIAGNOSIS — R5383 Other fatigue: Secondary | ICD-10-CM | POA: Diagnosis not present

## 2022-04-21 DIAGNOSIS — Z86718 Personal history of other venous thrombosis and embolism: Secondary | ICD-10-CM | POA: Diagnosis not present

## 2022-04-21 DIAGNOSIS — I739 Peripheral vascular disease, unspecified: Secondary | ICD-10-CM | POA: Diagnosis not present

## 2022-04-21 DIAGNOSIS — R2681 Unsteadiness on feet: Secondary | ICD-10-CM | POA: Diagnosis not present

## 2022-04-21 DIAGNOSIS — R5381 Other malaise: Secondary | ICD-10-CM | POA: Diagnosis not present

## 2023-05-04 DIAGNOSIS — E782 Mixed hyperlipidemia: Secondary | ICD-10-CM | POA: Insufficient documentation

## 2023-06-27 IMAGING — DX DG CHEST 2V
2 series · 2 of 2 positions shown · non-contrast
Comparison: Chest radiograph dated March 23, 2021

CLINICAL DATA: Shortness of breath

EXAM:
CHEST - 2 VIEW

[w chest pa]
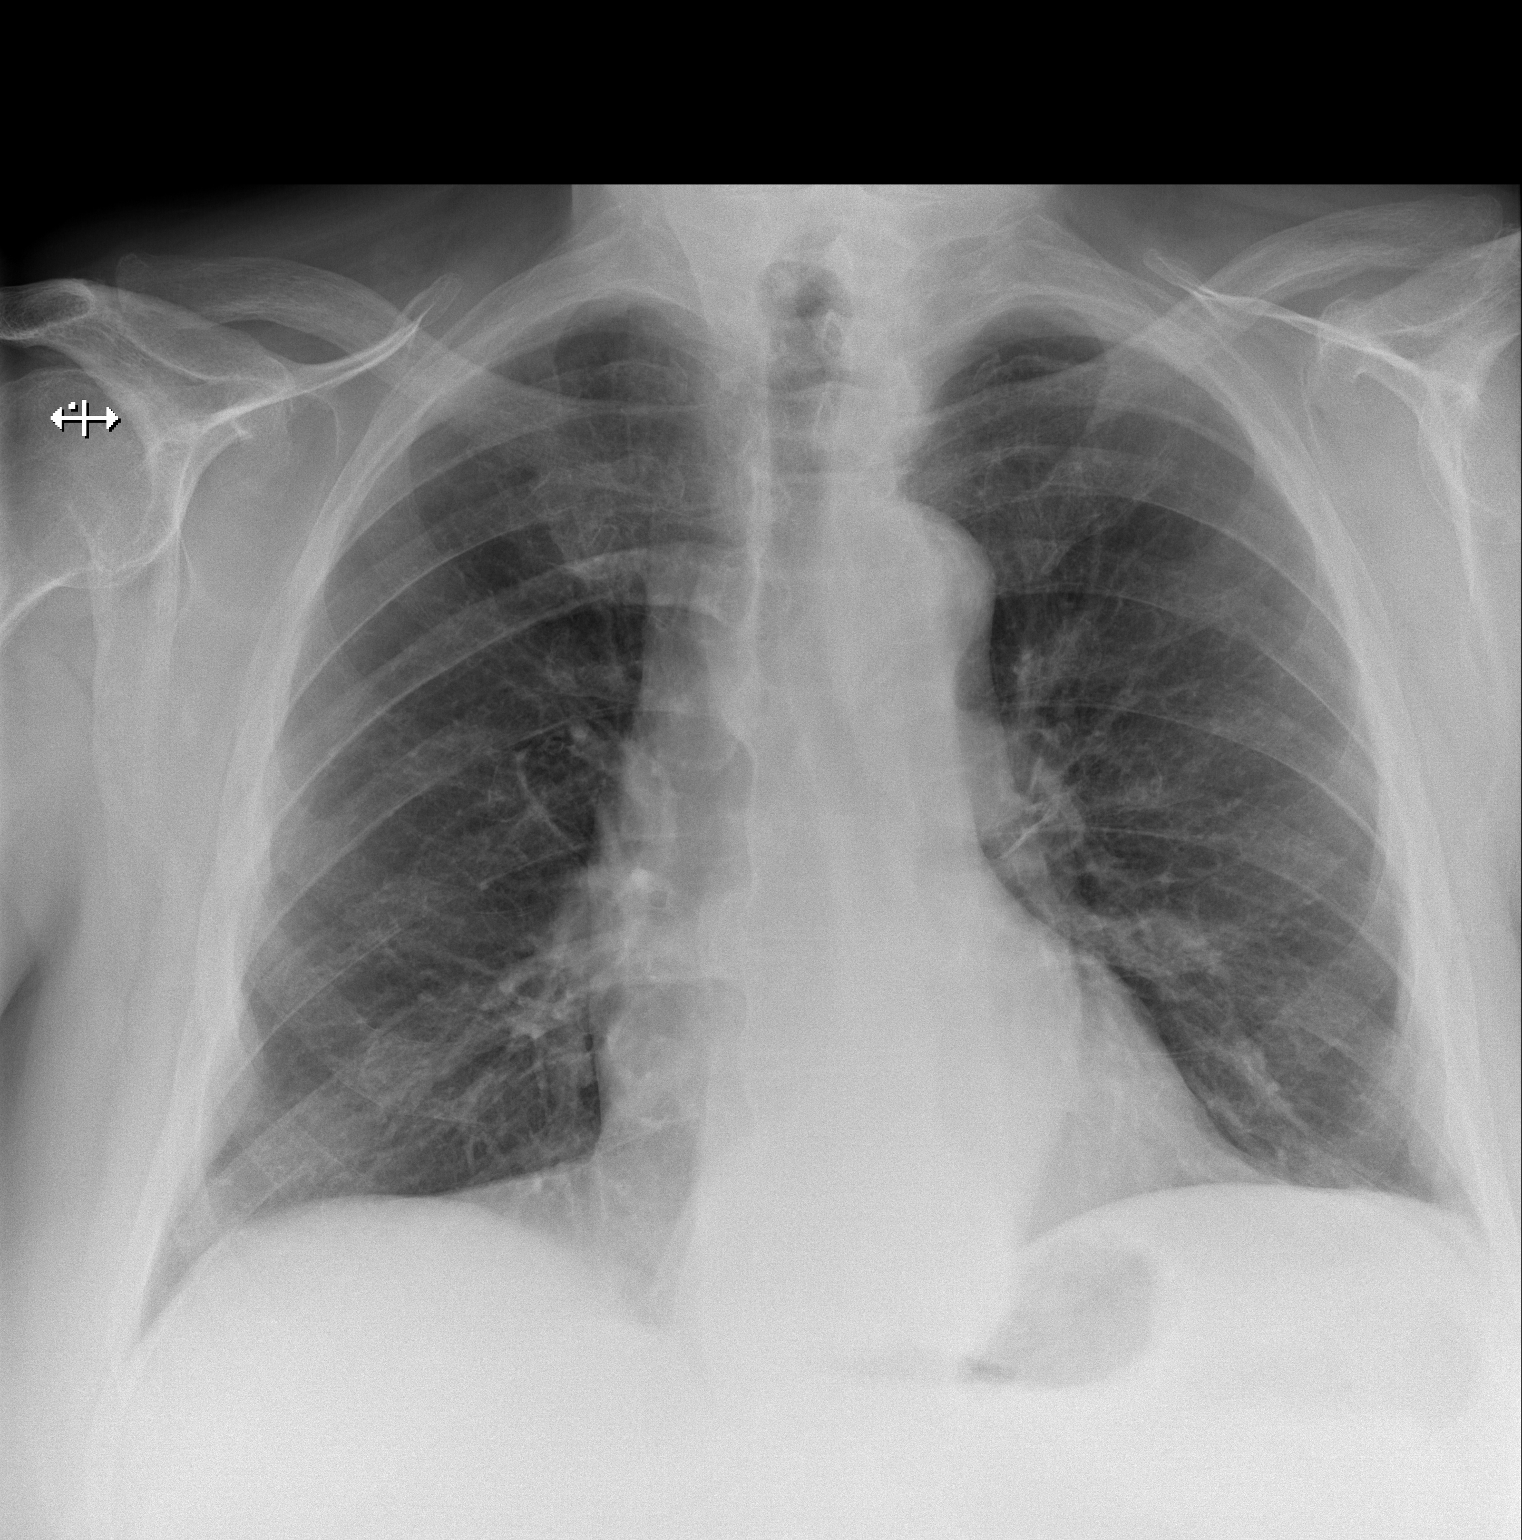

[w chest lat]
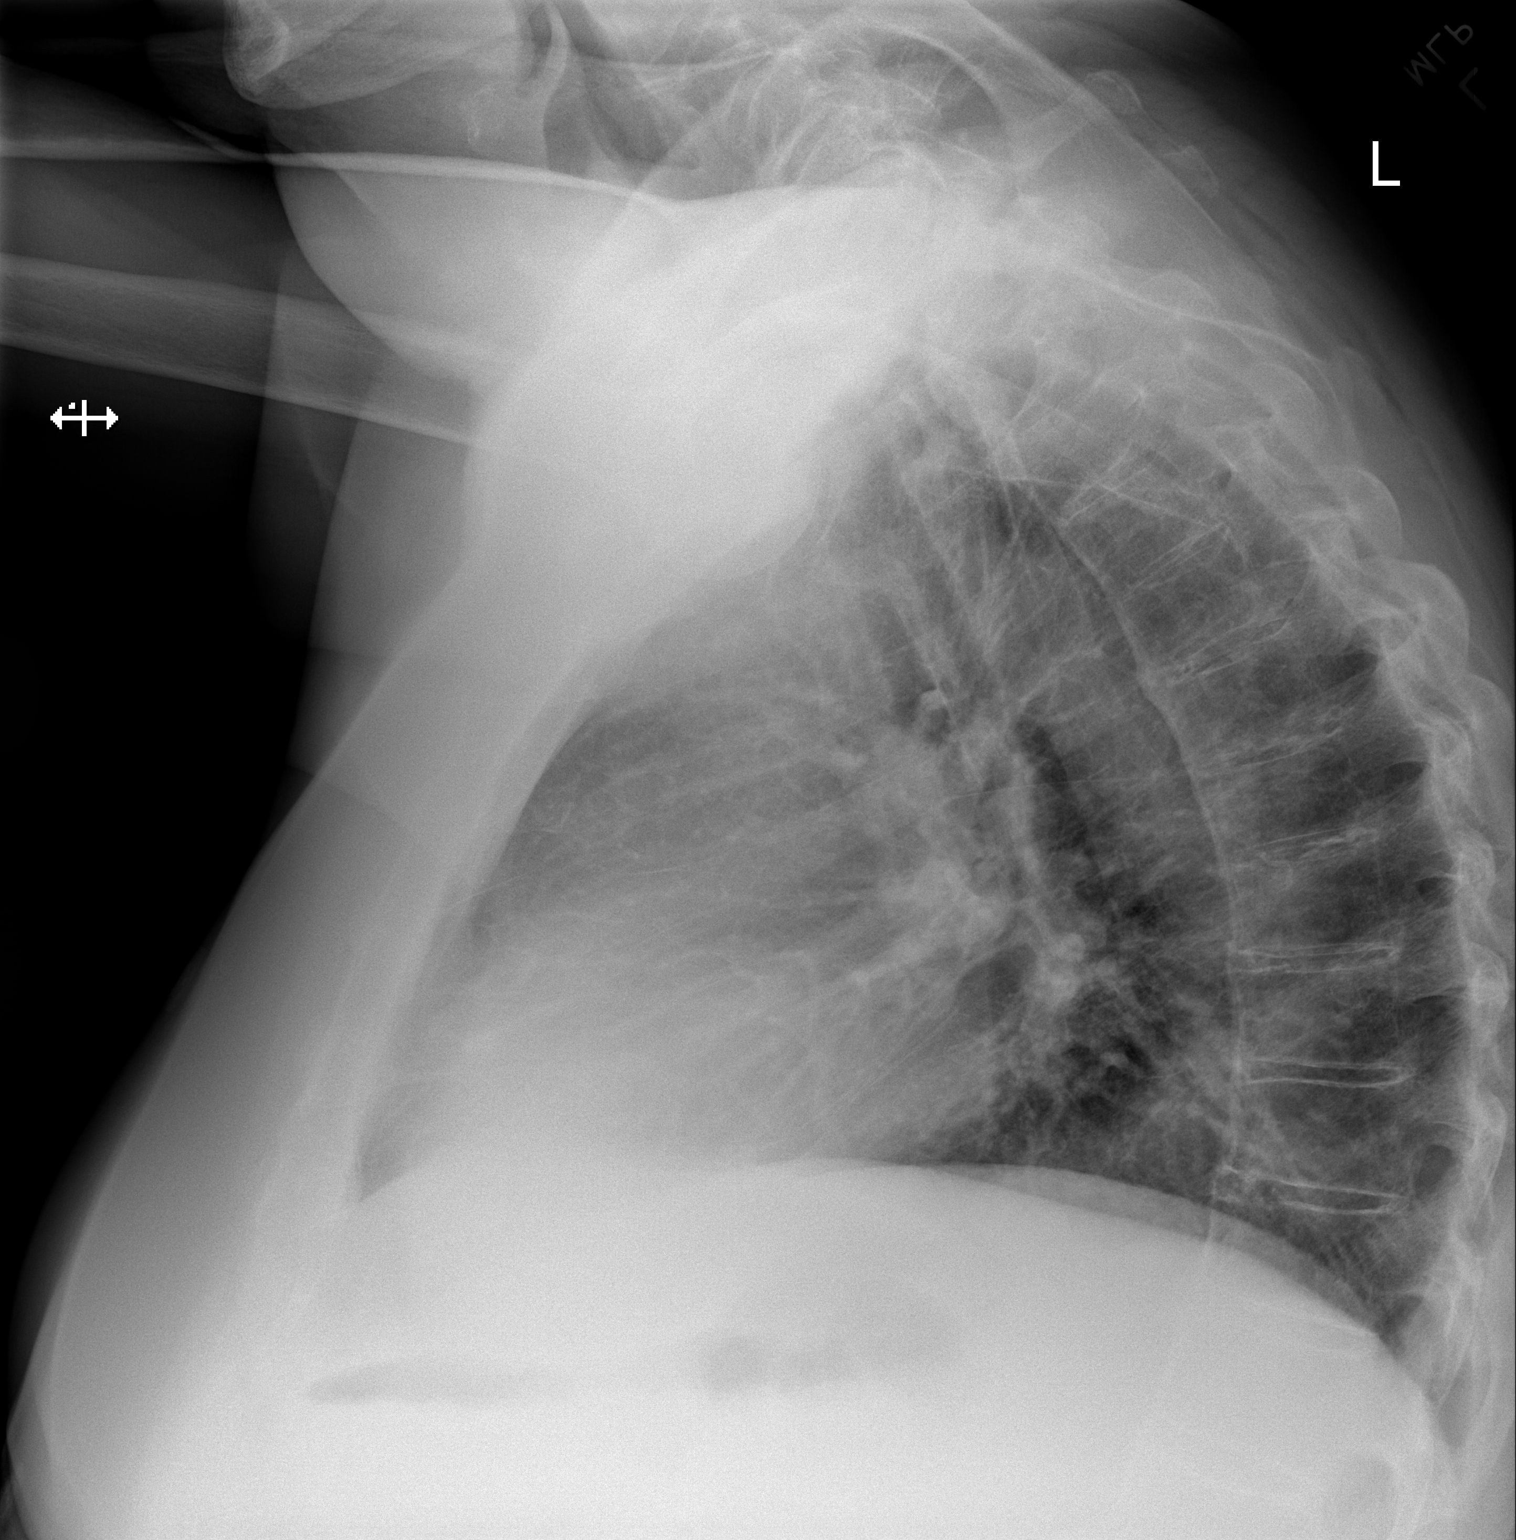

[2 of 2 positions shown; findings below may reference images not displayed]

FINDINGS: The heart size and mediastinal contours are within normal limits.
Both lungs are clear. The visualized skeletal structures are
unremarkable.
IMPRESSION: No active cardiopulmonary disease.

## 2023-10-05 ENCOUNTER — Ambulatory Visit: Admitting: Podiatry

## 2023-10-13 ENCOUNTER — Ambulatory Visit: Admitting: Podiatry

## 2023-10-13 DIAGNOSIS — I890 Lymphedema, not elsewhere classified: Secondary | ICD-10-CM

## 2023-10-13 DIAGNOSIS — L97511 Non-pressure chronic ulcer of other part of right foot limited to breakdown of skin: Secondary | ICD-10-CM

## 2023-10-13 DIAGNOSIS — Z8639 Personal history of other endocrine, nutritional and metabolic disease: Secondary | ICD-10-CM

## 2023-10-13 MED ORDER — GENTAMICIN SULFATE 0.1 % EX OINT
1.0000 | TOPICAL_OINTMENT | Freq: Every day | CUTANEOUS | 2 refills | Status: AC
Start: 2023-10-13 — End: ?

## 2023-10-13 NOTE — Progress Notes (Signed)
Chief Complaint  Patient presents with   Wound Check    There is a callous on the right 4th toe, there maybe an ulcer under it. They go to a beautician, who use to work in [employer], who told him to go to the doctor because of its an ulcer. Not diabetic and takes  ASA    HPI: 89 y.o. male presenting today with concern of a wound to the right fourth toe near the tip of the toe.  States that 6 weeks ago he got his nails trimmed at a salon and noticed a bleeding area on the tip of the toe.  He has been putting Neosporin on it, but has not sought care for this until now.  He does suffer from chronic lymphedema in the lower extremity with the right being worse than the left since his knee replacement.  States he only takes water pills when he feels it is necessary.  He described that previous issue with acute kidney problems when he was taking the diuretics regularly.  His daughter is with him today.  States he only wear shoes 5% of the time.  They are slippers.  On quick observation, there is no arch support or any cushioning to them.  He and his daughter state that they have issues with transportation.  States he is not diabetic.  His blood glucose in December 2024 is minimally elevated.  His POC glucose on 10/22/2022 was over 600.  Based on these values, I would have to question whether the patient is actually not diabetic, even though he claims he is not.  He takes Eliquis  daily.  Past Medical History:  Diagnosis Date   Arthritis    Cancer (HCC) 2013  [place]   Prostate   Cellulitis    right leg   Dyspnea    Pneumonia    last time 1955   Vertigo    11/10/17- "hasn't had an issue in months"   Past Surgical History:  Procedure Laterality Date   COLONOSCOPY W/ POLYPECTOMY     DEBRIDEMENT AND CLOSURE WOUND Right 09/08/2017   Procedure: RIGHT KNEE WOUND EXCISION WITH PLACEMENT OF ACELL AND VAC;  Surgeon: Thornell Flirt, DO;  Location: [facility];  Service: Plastics;   Laterality: Right;   INCISION AND DRAINAGE OF WOUND Right 11/10/2017   Procedure: IRRIGATION AND DEBRIDEMENT RIGHT KNEE WOUND WITH POSSIBLE ACELL PLACEMENT;  Surgeon: Thornell Flirt, DO;  Location[facility];  Service: Plastics;  Laterality: Right;   KNEE SURGERY Right 04/21/2017   remove bursa   NASAL HEMORRHAGE CONTROL     as a child   PROSTATECTOMY  2013   TONSILLECTOMY     Allergies  Allergen Reactions   Tylenol  [Acetaminophen ]     Severe indigestion   Sulfa Antibiotics Nausea Only    Burning esophagus   Smoker?: former smoker, quit 50 years ago   PHYSICAL EXAM: General: The patient is alert and oriented x3 in no acute distress.  Dermatology: Skin is warm, dry and supple bilateral lower extremities. Interspaces are clear of maceration and debris.      Wound 1:  Location: Distal right fourth toe        Depth: Superficial, pinpoint ulceration        Wound Border: Clear without erythema        Wound Base: Granular        Drainage: None       Odor?:  None  Surrounding Tissue: Without erythema or edema        Infected?:  No        Necrosis?:  No        Pain?:  No        Tunneling: No       Dimensions (cm): 0.1 x 0.1 x 0.1 cm  Vascular: Pedal pulses are trace palpable with +1 pitting edema bilateral legs and ankles.  Musculoskeletal Exam: No significant contracture noted of the right fourth toe.  ASSESSMENT / PLAN OF CARE: 1. Skin ulcer of fourth toe, limited to breakdown of skin, right (HCC)   2. Lymphedema   3. History of hyperglycemia     Meds ordered this encounter  Medications   gentamicin  ointment (GARAMYCIN ) 0.1 %    Sig: Apply 1 Application topically daily. Apply to wound daily and cover with Gauze dressing or Bandaid    Dispense:  30 g    Refill:  2   The ulceration was not debrided as there were no areas of hyperkeratotic or devitalized soft tissue for this pinpoint ulceration.  Antibiotic ointment and DSD applied.  Reviewed off-loading with  patient.  Recommend referral to [facility] for properly fitting shoes and possibly offloading inserts.  I do recommend that diabetic paperwork be sent to his primary care physician under the diabetic shoe program even though the patient is adamant he is not diabetic today.  Will send order for the shoes and inserts to their office and have them contact the patient and the patient's PCP for further clarification.  The patient may need some assistance in scheduling transportation to their office.  Reviewed daily dressing changes with patient.  Prescription for gentamicin  ointment sent to his drugstore.  Patient states he may be able to put a Band-Aid over this area.  Gauze was applied today after applying antibiotic ointment, but if the patient cannot do a gauze dressing will except a Band-Aid.  Within 1 week this should be closed.  Will have him follow-up in 1 to 2 weeks.  Encourage patient to speak to his PCP about his diuretic medication.  Due to the amount of swelling in the legs today, I would recommend that he take this regularly and daily and not on an "as needed basis" that he chooses.  I feel this may only be taken out of convenience due to bathroom urgency issues that may arise.  The swelling can make his shoes tighter and cause pressure.  Also, if his toes swell significantly even if he is not wearing shoes.  They may not flex and extend normally due to limited range of motion from the swelling and could result in a wound.  Recommended patient get on a regular schedule at our office for at risk footcare.  Discussed risks / concerns regarding ulcer with patient and possible sequelae if left untreated.  Stressed importance of infection prevention at home. Short-term goals are: prevent infection, off-load ulcer, heal ulcer Long-term goals are:  prevent recurrence, prevent amputation.    Joe Murders, DPM, FACFAS [facility]     [address] [place], [place] [address]                Office [phone]  Fax [phone])  [phone] 

## 2023-10-26 ENCOUNTER — Ambulatory Visit: Admitting: Podiatry
